# Patient Record
Sex: Female | Born: 1935 | Race: White | Hispanic: No | Smoking: Never smoker
Health system: Southern US, Community
[De-identification: ages and names within clinical notes are randomized; demographics above are authoritative.]

## PROBLEM LIST (undated history)

## (undated) DIAGNOSIS — I10 Essential (primary) hypertension: Secondary | ICD-10-CM

## (undated) DIAGNOSIS — Z803 Family history of malignant neoplasm of breast: Secondary | ICD-10-CM

## (undated) DIAGNOSIS — F32A Depression, unspecified: Secondary | ICD-10-CM

## (undated) DIAGNOSIS — C801 Malignant (primary) neoplasm, unspecified: Secondary | ICD-10-CM

## (undated) DIAGNOSIS — F329 Major depressive disorder, single episode, unspecified: Secondary | ICD-10-CM

## (undated) DIAGNOSIS — E785 Hyperlipidemia, unspecified: Secondary | ICD-10-CM

## (undated) HISTORY — DX: Essential (primary) hypertension: I10

## (undated) HISTORY — PX: LUMBAR DISC SURGERY: SHX700

## (undated) HISTORY — PX: HYSTERECTOMY ABDOMINAL WITH SALPINGO-OOPHORECTOMY: SHX6792

## (undated) HISTORY — PX: ABDOMINAL HYSTERECTOMY: SHX81

## (undated) HISTORY — PX: APPENDECTOMY: SHX54

## (undated) HISTORY — PX: CATARACT EXTRACTION, BILATERAL: SHX1313

## (undated) HISTORY — DX: Major depressive disorder, single episode, unspecified: F32.9

## (undated) HISTORY — DX: Hyperlipidemia, unspecified: E78.5

## (undated) HISTORY — PX: CHOLECYSTECTOMY: SHX55

## (undated) HISTORY — PX: BACK SURGERY: SHX140

## (undated) HISTORY — DX: Depression, unspecified: F32.A

## (undated) HISTORY — PX: INNER EAR SURGERY: SHX679

## (undated) HISTORY — DX: Family history of malignant neoplasm of breast: Z80.3

## (undated) HISTORY — PX: EYE SURGERY: SHX253

---

## 2017-09-01 ENCOUNTER — Encounter: Payer: Self-pay | Admitting: Internal Medicine

## 2017-09-04 ENCOUNTER — Encounter: Payer: Self-pay | Admitting: Internal Medicine

## 2017-09-04 ENCOUNTER — Ambulatory Visit: Payer: Medicare PPO | Admitting: Internal Medicine

## 2017-09-04 VITALS — BP 107/76 | HR 70 | Resp 16 | Ht 59.0 in | Wt 111.8 lb

## 2017-09-04 DIAGNOSIS — M1710 Unilateral primary osteoarthritis, unspecified knee: Secondary | ICD-10-CM | POA: Diagnosis not present

## 2017-09-04 DIAGNOSIS — F324 Major depressive disorder, single episode, in partial remission: Secondary | ICD-10-CM

## 2017-09-04 DIAGNOSIS — I10 Essential (primary) hypertension: Secondary | ICD-10-CM

## 2017-09-04 DIAGNOSIS — E782 Mixed hyperlipidemia: Secondary | ICD-10-CM | POA: Insufficient documentation

## 2017-09-04 MED ORDER — LOSARTAN POTASSIUM 25 MG PO TABS: 25.0000 mg | ORAL_TABLET | Freq: Every day | ORAL | 1 refills | Status: DC

## 2017-09-04 MED ORDER — SERTRALINE HCL 50 MG PO TABS: 50.0000 mg | ORAL_TABLET | Freq: Every day | ORAL | 1 refills | Status: DC

## 2017-09-04 MED ORDER — MELOXICAM 7.5 MG PO TABS: 7.5000 mg | ORAL_TABLET | Freq: Every day | ORAL | 1 refills | Status: DC

## 2017-09-04 MED ORDER — SERTRALINE HCL 25 MG PO TABS: 25.0000 mg | ORAL_TABLET | Freq: Every day | ORAL | 1 refills | Status: DC

## 2017-09-04 MED ORDER — PRAVASTATIN SODIUM 40 MG PO TABS: 40.0000 mg | ORAL_TABLET | Freq: Every day | ORAL | 1 refills | Status: DC

## 2017-09-04 NOTE — Progress Notes (Signed)
Date:  09/04/2017   Name:  Christina Mccarthy   DOB:  April 23, 1930   MRN:  [id number]   Chief Complaint: Establish Care (Moved from [place] ) and Knee Pain (Needs referral to Orthopedic she was getting steroid injections before moving here. ) She is here with her daughter Arrie Aran, with whom she lives and who is power of attorney.  Knee Pain   Incident onset: for years. There was no injury mechanism. She has tried NSAIDs (and steroid injections) for the symptoms.  Hypertension  This is a chronic problem. The problem is controlled. Pertinent negatives include no chest pain, headaches, palpitations or shortness of breath. Past treatments include angiotensin blockers.  Hyperlipidemia  This is a chronic problem. The problem is controlled. Recent lipid tests were reviewed and are normal. Pertinent negatives include no chest pain or shortness of breath. Current antihyperlipidemic treatment includes statins.  Depression         This is a chronic problem.  The onset quality is undetermined.   The problem has been gradually improving since onset.  Associated symptoms include no fatigue and no headaches.  Past treatments include SSRIs - Selective serotonin reuptake inhibitors.    Review of Systems  Constitutional: Negative for chills, fatigue and unexpected weight change.  HENT: Positive for hearing loss.   Eyes: Negative for visual disturbance.  Respiratory: Negative for cough, chest tightness, shortness of breath and wheezing.   Cardiovascular: Negative for chest pain, palpitations and leg swelling.  Gastrointestinal: Negative for abdominal pain.  Musculoskeletal: Positive for arthralgias and gait problem.  Neurological: Negative for dizziness and headaches.  Psychiatric/Behavioral: Positive for depression. Negative for dysphoric mood and sleep disturbance.    Patient Active Problem List   Diagnosis Date Noted  . Mixed hyperlipidemia 09/04/2017  . Essential hypertension 09/04/2017  . Primary  osteoarthritis of knee 09/04/2017  . Major depressive disorder with single episode, in partial remission ([place]) 09/04/2017    Prior to Admission medications   Medication Sig Start Date End Date Taking? Authorizing Provider  BIOTIN 5000 PO Take by mouth daily.   Yes [provider]  Cholecalciferol (VITAMIN D3) 1000 units CAPS Take by mouth.   Yes [provider]  losartan (COZAAR) 25 MG tablet Take 25 mg by mouth daily.  06/19/17  Yes [provider]  meloxicam (MOBIC) 7.5 MG tablet Take 7.5 mg by mouth daily.   Yes [provider]  Multiple Vitamins-Minerals (ICAPS AREDS 2 PO) Take by mouth.   Yes [provider]  Omega-3 Fatty Acids (FISH OIL PEARLS) 300 MG CAPS Take 300 mg by mouth daily.    Yes [provider]  pravastatin (PRAVACHOL) 40 MG tablet Take 40 mg by mouth.  06/19/17  Yes [provider]  sertraline (ZOLOFT) 25 MG tablet Take 25 mg by mouth daily.  06/19/17  Yes [provider]  sertraline (ZOLOFT) 50 MG tablet Take 50 mg by mouth daily.  06/28/17  Yes [provider]    No Known Allergies  Past Surgical History:  Procedure Laterality Date  . APPENDECTOMY    . CATARACT EXTRACTION, BILATERAL    . CHOLECYSTECTOMY    . HYSTERECTOMY ABDOMINAL WITH SALPINGO-OOPHORECTOMY     benign reason  . LUMBAR DISC SURGERY      Social History   Tobacco Use  . Smoking status: Never Smoker  . Smokeless tobacco: Never Used  Substance Use Topics  . Alcohol use: Never    Frequency: Never  . Drug use: Never  Medication list has been reviewed and updated.  PHQ 2/9 Scores 09/04/2017  PHQ - 2 Score 0    Physical Exam  Constitutional: She is oriented to person, place, and time. She appears well-developed. No distress.  HENT:  Head: Normocephalic and atraumatic.  Neck: Normal range of motion. Neck supple.  Cardiovascular: Normal rate, regular rhythm and normal heart sounds.  Pulmonary/Chest: Effort  normal and breath sounds normal. No respiratory distress.  Abdominal: Soft.  Musculoskeletal:       Right knee: She exhibits normal range of motion, no effusion and no bony tenderness.       Left knee: She exhibits decreased range of motion and bony tenderness. She exhibits no effusion.  Lymphadenopathy:    She has no cervical adenopathy.  Neurological: She is alert and oriented to person, place, and time. Gait (uses cane for ambulation) abnormal.  Skin: Skin is warm and dry. No rash noted.  Psychiatric: She has a normal mood and affect. Her speech is normal and behavior is normal. Thought content normal.    BP 107/76   Pulse 70   Resp 16   Ht 4\' 11"  (1.499 m)   Wt 111 lb 12.8 oz (50.7 kg)   SpO2 100%   BMI 22.58 kg/m   Assessment and Plan: 1. Essential hypertension controlled - losartan (COZAAR) 25 MG tablet; Take 1 tablet (25 mg total) by mouth daily.  Dispense: 90 tablet; Refill: 1 - CBC with Differential/Platelet - TSH  2. Mixed hyperlipidemia On statin therapy - pravastatin (PRAVACHOL) 40 MG tablet; Take 1 tablet (40 mg total) by mouth daily.  Dispense: 90 tablet; Refill: 1 - Comprehensive metabolic panel - Lipid panel  3. Primary osteoarthritis of knee, unspecified laterality Needs ortho follow up - mainly for left knee - meloxicam (MOBIC) 7.5 MG tablet; Take 1 tablet (7.5 mg total) by mouth daily.  Dispense: 90 tablet; Refill: 1 - Ambulatory referral to Orthopedic Surgery  4. Major depressive disorder with single episode, in partial remission ([place]) Doing well on current dose - sertraline (ZOLOFT) 25 MG tablet; Take 1 tablet (25 mg total) by mouth daily.  Dispense: 90 tablet; Refill: 1 - sertraline (ZOLOFT) 50 MG tablet; Take 1 tablet (50 mg total) by mouth daily.  Dispense: 90 tablet; Refill: 1  Will sign for records release to get immunization records and last labs.  Meds ordered this encounter  Medications  . losartan (COZAAR) 25 MG tablet    Sig: Take 1  tablet (25 mg total) by mouth daily.    Dispense:  90 tablet    Refill:  1  . sertraline (ZOLOFT) 25 MG tablet    Sig: Take 1 tablet (25 mg total) by mouth daily.    Dispense:  90 tablet    Refill:  1  . pravastatin (PRAVACHOL) 40 MG tablet    Sig: Take 1 tablet (40 mg total) by mouth daily.    Dispense:  90 tablet    Refill:  1  . sertraline (ZOLOFT) 50 MG tablet    Sig: Take 1 tablet (50 mg total) by mouth daily.    Dispense:  90 tablet    Refill:  1  . meloxicam (MOBIC) 7.5 MG tablet    Sig: Take 1 tablet (7.5 mg total) by mouth daily.    Dispense:  90 tablet    Refill:  1    Partially dictated using Editor, commissioning. Any errors are unintentional.  Halina Maidens, MD [facility]  09/04/2017  

## 2017-09-05 LAB — CBC WITH DIFFERENTIAL/PLATELET
BASOS ABS: 0 10*3/uL (ref 0.0–0.2)
Basos: 0 %
EOS (ABSOLUTE): 0.2 10*3/uL (ref 0.0–0.4)
EOS: 2 %
HEMATOCRIT: 41 % (ref 34.0–46.6)
Hemoglobin: 13.1 g/dL (ref 11.1–15.9)
Immature Grans (Abs): 0 10*3/uL (ref 0.0–0.1)
Immature Granulocytes: 0 %
LYMPHS ABS: 1.5 10*3/uL (ref 0.7–3.1)
Lymphs: 18 %
MCH: 29.1 pg (ref 26.6–33.0)
MCHC: 32 g/dL (ref 31.5–35.7)
MCV: 91 fL (ref 79–97)
Monocytes Absolute: 0.6 10*3/uL (ref 0.1–0.9)
Monocytes: 7 %
Neutrophils Absolute: 6.4 10*3/uL (ref 1.4–7.0)
Neutrophils: 73 %
Platelets: 265 10*3/uL (ref 150–379)
RBC: 4.5 x10E6/uL (ref 3.77–5.28)
RDW: 14.2 % (ref 12.3–15.4)
WBC: 8.7 10*3/uL (ref 3.4–10.8)

## 2017-09-05 LAB — COMPREHENSIVE METABOLIC PANEL
ALK PHOS: 78 IU/L (ref 39–117)
ALT: 9 IU/L (ref 0–32)
AST: 21 IU/L (ref 0–40)
Albumin/Globulin Ratio: 2.4 — ABNORMAL HIGH (ref 1.2–2.2)
Albumin: 4.5 g/dL (ref 3.5–4.7)
BUN/Creatinine Ratio: 23 (ref 12–28)
BUN: 24 mg/dL (ref 8–27)
Bilirubin Total: 0.5 mg/dL (ref 0.0–1.2)
CO2: 23 mmol/L (ref 20–29)
CREATININE: 1.03 mg/dL — AB (ref 0.57–1.00)
Calcium: 10.2 mg/dL (ref 8.7–10.3)
Chloride: 100 mmol/L (ref 96–106)
GFR calc Af Amer: 57 mL/min/{1.73_m2} — ABNORMAL LOW (ref >59)
GFR, EST NON AFRICAN AMERICAN: 49 mL/min/{1.73_m2} — AB (ref >59)
GLOBULIN, TOTAL: 1.9 g/dL (ref 1.5–4.5)
Glucose: 90 mg/dL (ref 65–99)
Potassium: 4.9 mmol/L (ref 3.5–5.2)
SODIUM: 138 mmol/L (ref 134–144)
Total Protein: 6.4 g/dL (ref 6.0–8.5)

## 2017-09-05 LAB — TSH: TSH: 3.95 u[IU]/mL (ref 0.450–4.500)

## 2017-09-05 LAB — LIPID PANEL
Chol/HDL Ratio: 3.6 ratio (ref 0.0–4.4)
Cholesterol, Total: 209 mg/dL — ABNORMAL HIGH (ref 100–199)
HDL: 58 mg/dL (ref >39)
LDL CALC: 120 mg/dL — AB (ref 0–99)
TRIGLYCERIDES: 155 mg/dL — AB (ref 0–149)
VLDL Cholesterol Cal: 31 mg/dL (ref 5–40)

## 2017-09-13 ENCOUNTER — Other Ambulatory Visit: Payer: Self-pay

## 2017-09-13 DIAGNOSIS — F324 Major depressive disorder, single episode, in partial remission: Secondary | ICD-10-CM

## 2017-09-13 DIAGNOSIS — M1710 Unilateral primary osteoarthritis, unspecified knee: Secondary | ICD-10-CM

## 2017-09-13 MED ORDER — SERTRALINE HCL 50 MG PO TABS: 50.0000 mg | ORAL_TABLET | Freq: Every day | ORAL | 0 refills | Status: DC

## 2017-09-13 MED ORDER — SERTRALINE HCL 25 MG PO TABS: 25.0000 mg | ORAL_TABLET | Freq: Every day | ORAL | 0 refills | Status: DC

## 2017-09-13 MED ORDER — MELOXICAM 7.5 MG PO TABS: 7.5000 mg | ORAL_TABLET | Freq: Every day | ORAL | 0 refills | Status: DC

## 2017-10-10 ENCOUNTER — Other Ambulatory Visit: Payer: Self-pay | Admitting: Internal Medicine

## 2017-10-10 DIAGNOSIS — F324 Major depressive disorder, single episode, in partial remission: Secondary | ICD-10-CM

## 2017-11-03 ENCOUNTER — Other Ambulatory Visit: Payer: Self-pay | Admitting: Internal Medicine

## 2017-11-03 DIAGNOSIS — F324 Major depressive disorder, single episode, in partial remission: Secondary | ICD-10-CM

## 2017-11-06 ENCOUNTER — Other Ambulatory Visit: Payer: Self-pay | Admitting: Internal Medicine

## 2017-12-12 DIAGNOSIS — M170 Bilateral primary osteoarthritis of knee: Secondary | ICD-10-CM | POA: Diagnosis not present

## 2017-12-21 ENCOUNTER — Other Ambulatory Visit: Payer: Self-pay | Admitting: Internal Medicine

## 2017-12-21 DIAGNOSIS — I10 Essential (primary) hypertension: Secondary | ICD-10-CM

## 2017-12-21 DIAGNOSIS — F324 Major depressive disorder, single episode, in partial remission: Secondary | ICD-10-CM

## 2017-12-21 DIAGNOSIS — M1710 Unilateral primary osteoarthritis, unspecified knee: Secondary | ICD-10-CM

## 2017-12-21 DIAGNOSIS — E782 Mixed hyperlipidemia: Secondary | ICD-10-CM

## 2018-01-10 ENCOUNTER — Ambulatory Visit: Payer: Medicare PPO

## 2018-02-21 ENCOUNTER — Ambulatory Visit (INDEPENDENT_AMBULATORY_CARE_PROVIDER_SITE_OTHER): Payer: Medicare PPO

## 2018-02-21 DIAGNOSIS — Z23 Encounter for immunization: Secondary | ICD-10-CM | POA: Diagnosis not present

## 2018-03-09 ENCOUNTER — Encounter: Payer: Self-pay | Admitting: Internal Medicine

## 2018-03-09 ENCOUNTER — Ambulatory Visit (INDEPENDENT_AMBULATORY_CARE_PROVIDER_SITE_OTHER): Payer: Medicare PPO | Admitting: Internal Medicine

## 2018-03-09 VITALS — BP 122/74 | HR 74 | Resp 16 | Ht 59.0 in | Wt 114.0 lb

## 2018-03-09 DIAGNOSIS — C50912 Malignant neoplasm of unspecified site of left female breast: Secondary | ICD-10-CM | POA: Insufficient documentation

## 2018-03-09 DIAGNOSIS — Z0000 Encounter for general adult medical examination without abnormal findings: Secondary | ICD-10-CM | POA: Diagnosis not present

## 2018-03-09 DIAGNOSIS — E782 Mixed hyperlipidemia: Secondary | ICD-10-CM

## 2018-03-09 DIAGNOSIS — M170 Bilateral primary osteoarthritis of knee: Secondary | ICD-10-CM

## 2018-03-09 DIAGNOSIS — I10 Essential (primary) hypertension: Secondary | ICD-10-CM

## 2018-03-09 DIAGNOSIS — F324 Major depressive disorder, single episode, in partial remission: Secondary | ICD-10-CM

## 2018-03-09 DIAGNOSIS — N63 Unspecified lump in unspecified breast: Secondary | ICD-10-CM

## 2018-03-09 NOTE — Patient Instructions (Signed)
Someone from [facility] will call you to schedule the Mammograms.

## 2018-03-09 NOTE — Progress Notes (Signed)
Date:  03/09/2018   Name:  Christina Mccarthy   DOB:  April 17, 1930   MRN:  [id number]   Chief Complaint: Annual Exam and Knee Pain (ortho appt Wed for injections left knee pain and pain under thights in R and L legs ) Christina Mccarthy is a 84 y.o. female who presents today for her Complete Annual Exam. She feels fairly well. She reports exercising none but has worsening knee pain. She reports she is sleeping fairly well.   Hypertension  This is a chronic problem. The problem is controlled. Pertinent negatives include no chest pain, headaches, palpitations or shortness of breath. Past treatments include angiotensin blockers. The current treatment provides significant improvement.  Depression         This is a chronic problem.The problem is unchanged.  Associated symptoms include no fatigue and no headaches.  Past treatments include SSRIs - Selective serotonin reuptake inhibitors.  Compliance with treatment is good.  Previous treatment provided significant relief. Hyperlipidemia  The problem is controlled. Pertinent negatives include no chest pain or shortness of breath. Current antihyperlipidemic treatment includes statins. The current treatment provides significant improvement of lipids.    Review of Systems  Constitutional: Negative for chills, fatigue and fever.  HENT: Negative for congestion, hearing loss, tinnitus, trouble swallowing and voice change.   Eyes: Negative for visual disturbance.  Respiratory: Negative for cough, chest tightness, shortness of breath and wheezing.   Cardiovascular: Negative for chest pain, palpitations and leg swelling.  Gastrointestinal: Negative for abdominal pain, constipation, diarrhea and vomiting.  Endocrine: Negative for polydipsia and polyuria.  Genitourinary: Negative for dysuria, frequency, genital sores, vaginal bleeding and vaginal discharge.  Musculoskeletal: Negative for arthralgias, gait problem and joint swelling.  Skin: Negative for color change and  rash.  Neurological: Negative for dizziness, tremors, light-headedness and headaches.  Hematological: Negative for adenopathy. Does not bruise/bleed easily.  Psychiatric/Behavioral: Positive for depression. Negative for dysphoric mood and sleep disturbance. The patient is not nervous/anxious.     Patient Active Problem List   Diagnosis Date Noted  . Mixed hyperlipidemia 09/04/2017  . Essential hypertension 09/04/2017  . Primary osteoarthritis of knee 09/04/2017  . Major depressive disorder with single episode, in partial remission ([place]) 09/04/2017    No Known Allergies  Past Surgical History:  Procedure Laterality Date  . APPENDECTOMY    . CATARACT EXTRACTION, BILATERAL    . CHOLECYSTECTOMY    . HYSTERECTOMY ABDOMINAL WITH SALPINGO-OOPHORECTOMY     benign reason  . LUMBAR DISC SURGERY      Social History   Tobacco Use  . Smoking status: Never Smoker  . Smokeless tobacco: Never Used  Substance Use Topics  . Alcohol use: Never    Frequency: Never  . Drug use: Never     Medication list has been reviewed and updated.  Current Meds  Medication Sig  . BIOTIN 5000 PO Take by mouth daily.  . Cholecalciferol (VITAMIN D3) 1000 units CAPS Take by mouth.  . losartan (COZAAR) 25 MG tablet TAKE 1 TABLET DAILY.  . meloxicam (MOBIC) 7.5 MG tablet TAKE 1 TABLET DAILY.  . Multiple Vitamins-Minerals (ICAPS AREDS 2 PO) Take by mouth.  . Omega-3 Fatty Acids (FISH OIL PEARLS) 300 MG CAPS Take 300 mg by mouth daily.   . pravastatin (PRAVACHOL) 40 MG tablet TAKE 1 TABLET DAILY.  Marland Kitchen sertraline (ZOLOFT) 25 MG tablet TAKE 1 TABLET DAILY.  Marland Kitchen sertraline (ZOLOFT) 50 MG tablet TAKE 1 TABLET DAILY.    PHQ 2/9 Scores 03/09/2018 09/04/2017  PHQ - 2 Score 0 0  PHQ- 9 Score 0 -    Physical Exam  Constitutional: She is oriented to person, place, and time. She appears well-developed. No distress.  HENT:  Head: Normocephalic and atraumatic.  Right Ear: Decreased hearing is noted.  Left Ear:  Decreased hearing is noted.  Hearing aids in place  Eyes: Pupils are equal, round, and reactive to light.  Neck: Normal range of motion. Neck supple.  Cardiovascular: Normal rate, regular rhythm and normal heart sounds.  No murmur heard. Pulmonary/Chest: Effort normal and breath sounds normal. No respiratory distress. She has no wheezes. She has no rales.    Abdominal: Soft. Normal appearance and bowel sounds are normal. There is no tenderness.  Musculoskeletal: She exhibits no edema.       Right knee: She exhibits decreased range of motion. She exhibits no swelling and no effusion.       Left knee: She exhibits decreased range of motion. She exhibits no swelling and no effusion.  Lymphadenopathy:    She has no cervical adenopathy.  Neurological: She is alert and oriented to person, place, and time. No sensory deficit. Gait (uses rollator) abnormal.  Skin: Skin is warm and dry. No rash noted.  Psychiatric: She has a normal mood and affect. Her speech is normal and behavior is normal. Thought content normal. Cognition and memory are normal.  Nursing note and vitals reviewed.   BP 122/74   Pulse 74   Resp 16   Ht 4\' 11"  (1.499 m)   Wt 114 lb (51.7 kg)   SpO2 98%   BMI 23.03 kg/m   Assessment and Plan: 1. Annual physical exam Generally doing well Will schedule MAW  2. Essential hypertension Controlled, continue current therapy - CBC with Differential/Platelet - Comprehensive metabolic panel - TSH  3. Mixed hyperlipidemia On statin medication - Lipid panel  4. Major depressive disorder with single episode, in partial remission ([place]) Doing well  5. Breast mass in female Needs further evaluation - MM DIAG BREAST TOMO BILATERAL; Future - US BREAST LTD UNI LEFT INC AXILLA; Future - US BREAST LTD UNI RIGHT INC AXILLA; Future  6. Primary osteoarthritis of both knees Right worse than left Follow up with Ortho as planned  Partially dictated using Editor, commissioning. Any  errors are unintentional.  Halina Maidens, MD [facility]  03/09/2018

## 2018-03-10 LAB — COMPREHENSIVE METABOLIC PANEL
A/G RATIO: 2.8 — AB (ref 1.2–2.2)
ALBUMIN: 4.4 g/dL (ref 3.5–4.7)
ALT: 11 IU/L (ref 0–32)
AST: 22 IU/L (ref 0–40)
Alkaline Phosphatase: 80 IU/L (ref 39–117)
BILIRUBIN TOTAL: 0.4 mg/dL (ref 0.0–1.2)
BUN / CREAT RATIO: 15 (ref 12–28)
BUN: 15 mg/dL (ref 8–27)
CHLORIDE: 98 mmol/L (ref 96–106)
CO2: 25 mmol/L (ref 20–29)
Calcium: 9.7 mg/dL (ref 8.7–10.3)
Creatinine, Ser: 0.99 mg/dL (ref 0.57–1.00)
GFR calc Af Amer: 60 mL/min/{1.73_m2} (ref >59)
GFR calc non Af Amer: 52 mL/min/{1.73_m2} — ABNORMAL LOW (ref >59)
GLOBULIN, TOTAL: 1.6 g/dL (ref 1.5–4.5)
Glucose: 76 mg/dL (ref 65–99)
POTASSIUM: 5 mmol/L (ref 3.5–5.2)
SODIUM: 137 mmol/L (ref 134–144)
Total Protein: 6 g/dL (ref 6.0–8.5)

## 2018-03-10 LAB — CBC WITH DIFFERENTIAL/PLATELET
Basophils Absolute: 0.1 10*3/uL (ref 0.0–0.2)
Basos: 1 %
EOS (ABSOLUTE): 0.2 10*3/uL (ref 0.0–0.4)
EOS: 2 %
HEMATOCRIT: 39.8 % (ref 34.0–46.6)
HEMOGLOBIN: 13.1 g/dL (ref 11.1–15.9)
Immature Grans (Abs): 0 10*3/uL (ref 0.0–0.1)
Immature Granulocytes: 0 %
LYMPHS ABS: 1.2 10*3/uL (ref 0.7–3.1)
Lymphs: 14 %
MCH: 29.4 pg (ref 26.6–33.0)
MCHC: 32.9 g/dL (ref 31.5–35.7)
MCV: 89 fL (ref 79–97)
MONOS ABS: 0.5 10*3/uL (ref 0.1–0.9)
Monocytes: 6 %
NEUTROS ABS: 6.5 10*3/uL (ref 1.4–7.0)
Neutrophils: 77 %
Platelets: 297 10*3/uL (ref 150–450)
RBC: 4.46 x10E6/uL (ref 3.77–5.28)
RDW: 12.5 % (ref 12.3–15.4)
WBC: 8.5 10*3/uL (ref 3.4–10.8)

## 2018-03-10 LAB — LIPID PANEL
CHOL/HDL RATIO: 3.6 ratio (ref 0.0–4.4)
Cholesterol, Total: 200 mg/dL — ABNORMAL HIGH (ref 100–199)
HDL: 56 mg/dL (ref >39)
LDL CALC: 122 mg/dL — AB (ref 0–99)
Triglycerides: 108 mg/dL (ref 0–149)
VLDL Cholesterol Cal: 22 mg/dL (ref 5–40)

## 2018-03-10 LAB — TSH: TSH: 2.9 u[IU]/mL (ref 0.450–4.500)

## 2018-03-14 DIAGNOSIS — M170 Bilateral primary osteoarthritis of knee: Secondary | ICD-10-CM | POA: Diagnosis not present

## 2018-03-26 ENCOUNTER — Ambulatory Visit: Payer: Medicare PPO

## 2018-04-01 DIAGNOSIS — C50919 Malignant neoplasm of unspecified site of unspecified female breast: Secondary | ICD-10-CM

## 2018-04-01 HISTORY — PX: BREAST BIOPSY: SHX20

## 2018-04-01 HISTORY — DX: Malignant neoplasm of unspecified site of unspecified female breast: C50.919

## 2018-04-13 ENCOUNTER — Ambulatory Visit
Admission: RE | Admit: 2018-04-13 | Discharge: 2018-04-13 | Disposition: A | Discharge status: Home / Self Care | Payer: Medicare PPO | Source: Ambulatory Visit | Attending: Internal Medicine | Admitting: Internal Medicine

## 2018-04-13 ENCOUNTER — Encounter: Payer: Self-pay | Admitting: Radiology

## 2018-04-13 DIAGNOSIS — N6321 Unspecified lump in the left breast, upper outer quadrant: Secondary | ICD-10-CM | POA: Insufficient documentation

## 2018-04-13 DIAGNOSIS — N63 Unspecified lump in unspecified breast: Secondary | ICD-10-CM

## 2018-04-13 DIAGNOSIS — N6310 Unspecified lump in the right breast, unspecified quadrant: Secondary | ICD-10-CM | POA: Insufficient documentation

## 2018-04-13 DIAGNOSIS — C801 Malignant (primary) neoplasm, unspecified: Secondary | ICD-10-CM

## 2018-04-13 DIAGNOSIS — N6489 Other specified disorders of breast: Secondary | ICD-10-CM | POA: Diagnosis not present

## 2018-04-13 DIAGNOSIS — R928 Other abnormal and inconclusive findings on diagnostic imaging of breast: Secondary | ICD-10-CM | POA: Diagnosis not present

## 2018-04-13 HISTORY — DX: Malignant (primary) neoplasm, unspecified: C80.1

## 2018-04-16 ENCOUNTER — Other Ambulatory Visit: Payer: Self-pay | Admitting: Internal Medicine

## 2018-04-16 DIAGNOSIS — R928 Other abnormal and inconclusive findings on diagnostic imaging of breast: Secondary | ICD-10-CM

## 2018-04-16 DIAGNOSIS — N6320 Unspecified lump in the left breast, unspecified quadrant: Secondary | ICD-10-CM

## 2018-04-18 ENCOUNTER — Ambulatory Visit
Admission: RE | Admit: 2018-04-18 | Discharge: 2018-04-18 | Disposition: A | Discharge status: Home / Self Care | Payer: Medicare PPO | Source: Ambulatory Visit | Attending: Internal Medicine | Admitting: Internal Medicine

## 2018-04-18 DIAGNOSIS — N6320 Unspecified lump in the left breast, unspecified quadrant: Secondary | ICD-10-CM

## 2018-04-18 DIAGNOSIS — R928 Other abnormal and inconclusive findings on diagnostic imaging of breast: Secondary | ICD-10-CM

## 2018-04-18 DIAGNOSIS — C50412 Malignant neoplasm of upper-outer quadrant of left female breast: Secondary | ICD-10-CM | POA: Diagnosis not present

## 2018-04-18 DIAGNOSIS — N6321 Unspecified lump in the left breast, upper outer quadrant: Secondary | ICD-10-CM | POA: Diagnosis not present

## 2018-04-20 ENCOUNTER — Encounter: Payer: Self-pay | Admitting: *Deleted

## 2018-04-20 ENCOUNTER — Encounter: Payer: Self-pay | Admitting: Internal Medicine

## 2018-04-20 DIAGNOSIS — C50912 Malignant neoplasm of unspecified site of left female breast: Secondary | ICD-10-CM

## 2018-04-20 NOTE — Progress Notes (Signed)
  Oncology Nurse Navigator Documentation  Navigator Location: [facility] (04/20/18 1200)   )Navigator Encounter Type: Introductory phone call (04/20/18 1200)   Abnormal Finding Date: 04/13/18 (04/20/18 1200) Confirmed Diagnosis Date: 04/19/18 (04/20/18 1200)                   Barriers/Navigation Needs: Coordination of Care (04/20/18 1200)   Interventions: Coordination of Care (04/20/18 1200)   Coordination of Care: Appts (04/20/18 1200)                  Time Spent with Patient: 15 (04/20/18 1200)   Called patient and spoke to her daughter Boyd.  Introduced to navigation services.  Patient's daughter had been informed of patient's diagnosis by Dr. Enriqueta Shutter in radiology.  Appointment scheduled to see Dr. Dahlia Byes on 04/23/18 @ 9:30.  Will schedule medical oncology consult on Monday.  Dawn informed I would call her back on Monday.  She is to call with any questions or needs.

## 2018-04-22 NOTE — Progress Notes (Signed)
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  INTERVAL HISTORY: Patient is an 84 year old female who noticed a mass in her left breast on self-exam.  Subsequent mammogram, ultrasound, biopsy revealed the above-stated malignancy.  She currently feels well and is asymptomatic.  She remains active.  She has no neurologic complaints.  She denies any recent fevers or illnesses.  She has a good appetite and denies weight loss.  She has no chest pain or shortness of breath.  She denies any nausea, vomiting, constipation, or diarrhea.  She has no urinary complaints.  Patient feels at her baseline offers no further specific complaints today.  REVIEW OF SYSTEMS:   Review of Systems  Constitutional: Negative.  Negative for fever, malaise/fatigue and weight loss.  Respiratory: Negative.  Negative for cough and shortness of breath.   Cardiovascular: Negative.  Negative for chest pain and leg swelling.  Gastrointestinal: Negative.  Negative for abdominal pain.  Genitourinary: Negative.  Negative for dysuria.  Musculoskeletal: Negative.  Negative for back pain.  Skin: Negative.  Negative for rash.  Neurological: Negative.  Negative for sensory change, focal weakness and weakness.  Psychiatric/Behavioral: Negative.  The patient is not nervous/anxious.     As per HPI. Otherwise, a complete review of systems is negative.  PAST MEDICAL HISTORY: Past Medical History:  Diagnosis Date  . Depression   . Hyperlipidemia   . Hypertension     PAST SURGICAL HISTORY: Past Surgical History:  Procedure Laterality Date  . APPENDECTOMY    . CATARACT  EXTRACTION, BILATERAL    . CHOLECYSTECTOMY    . EYE SURGERY    . HYSTERECTOMY ABDOMINAL WITH SALPINGO-OOPHORECTOMY     benign reason  . LUMBAR DISC SURGERY      FAMILY HISTORY: Family History  Problem Relation Age of Onset  . Breast cancer Mother 28  . CAD Father     ADVANCED DIRECTIVES (Y/N):  N  HEALTH MAINTENANCE: Social History   Tobacco Use  . Smoking status: Never Smoker  . Smokeless tobacco: Never Used  Substance Use Topics  . Alcohol use: Never    Frequency: Never  . Drug use: Never     Colonoscopy:  PAP:  Bone density:  Lipid panel:  No Known Allergies  Current Outpatient Medications  Medication Sig Dispense Refill  . BIOTIN 5000 PO Take by mouth daily.    . Cholecalciferol (VITAMIN D3) 1000 units CAPS Take by mouth.    . losartan (COZAAR) 25 MG tablet TAKE 1 TABLET DAILY. 90 tablet 1  . meloxicam (MOBIC) 7.5 MG tablet TAKE 1 TABLET DAILY. 90 tablet 1  . Multiple Vitamins-Minerals (ICAPS AREDS 2 PO) Take by mouth.    . Omega-3 Fatty Acids (FISH OIL PEARLS) 300 MG CAPS Take 300 mg by mouth daily.     . pravastatin (PRAVACHOL) 40 MG tablet TAKE 1 TABLET DAILY. 90 tablet 1  . sertraline (ZOLOFT) 25 MG tablet TAKE 1 TABLET DAILY. 90 tablet 1  . sertraline (ZOLOFT) 50 MG tablet TAKE 1 TABLET DAILY. 90 tablet 1   No current facility-administered medications for this visit.     OBJECTIVE: Vitals:   04/23/18 1050  BP: 140/70  Pulse: 72  Temp: 97.7 F (36.5 C)     Body mass index is 22.42 kg/m.    ECOG FS:0 - Asymptomatic  General: Well-developed, well-nourished, no acute distress. Eyes: Pink conjunctiva, anicteric sclera. HEENT: Normocephalic, moist mucous membranes, clear oropharnyx. Breast: Patient had exam by surgeon this morning and requested exam be deferred. Lungs: Clear to auscultation bilaterally. Heart: Regular rate and rhythm. No rubs, murmurs, or gallops. Abdomen: Soft, nontender, nondistended. No organomegaly noted, normoactive bowel  sounds. Musculoskeletal: No edema, cyanosis, or clubbing. Neuro: Alert, answering all questions appropriately. Cranial nerves grossly intact. Skin: No rashes or petechiae noted. Psych: Normal affect. Lymphatics: No cervical, calvicular, axillary or inguinal LAD.   LAB RESULTS:  Lab Results  Component Value Date   NA 137 03/09/2018   K 5.0 03/09/2018   CL 98 03/09/2018   CO2 25 03/09/2018   GLUCOSE 76 03/09/2018   BUN 15 03/09/2018   CREATININE 0.99 03/09/2018   CALCIUM 9.7 03/09/2018   PROT 6.0 03/09/2018   ALBUMIN 4.4 03/09/2018   AST 22 03/09/2018   ALT 11 03/09/2018   ALKPHOS 80 03/09/2018   BILITOT 0.4 03/09/2018   GFRNONAA 52 (L) 03/09/2018   GFRAA 60 03/09/2018    Lab Results  Component Value Date   WBC 8.5 03/09/2018   NEUTROABS 6.5 03/09/2018   HGB 13.1 03/09/2018   HCT 39.8 03/09/2018   MCV 89 03/09/2018   PLT 297 03/09/2018     STUDIES: US Breast Ltd Uni Left Inc Axilla  Result Date: 04/13/2018 CLINICAL DATA:  84 year old female with palpable masses in both breasts discovered on self and clinical examination. EXAM: DIGITAL DIAGNOSTIC BILATERAL MAMMOGRAM WITH CAD AND TOMO ULTRASOUND BILATERAL BREAST COMPARISON:  Previous exam(s). ACR Breast Density Category b: There are scattered areas of fibroglandular density. FINDINGS: 2D/3D full field views of both breasts and a spot compression view of the LEFT breast are performed. No suspicious mammographic abnormalities are identified within the RIGHT breast. An irregular mass within the UPPER-OUTER LEFT breast is noted. Mammographic images were processed with CAD. On physical exam, a firm palpable mass identified at the 1 o'clock position of the LEFT breast 4 cm from the nipple. Targeted ultrasound is performed, showing . no suspicious abnormalities are identified within the RIGHT breast. A 2.2 x 1.3 x 2.3 cm irregular hypoechoic mass at the 1 o'clock position of the LEFT breast 4 cm from the nipple is identified. No  abnormal LEFT axillary lymph nodes are noted. IMPRESSION: 1. Highly suspicious 2.3 cm mass within the UPPER OUTER LEFT breast. Tissue sampling is recommended. 2. No abnormal appearing LEFT axillary lymph nodes. 3. No mammographic evidence of RIGHT breast malignancy. RECOMMENDATION: Ultrasound-guided LEFT breast biopsy. I have discussed the findings and recommendations with the patient. Results were also provided in writing at the conclusion of the visit. If applicable, a reminder letter will be sent to the patient regarding the next appointment. BI-RADS CATEGORY  5: Highly suggestive of malignancy. Electronically Signed   By: Margarette Canada M.D.   On: 04/13/2018 11:10   US Breast Ltd Uni Right Inc Axilla  Result Date: 04/13/2018 CLINICAL DATA:  84 year old female with palpable masses in both breasts discovered on self and clinical examination. EXAM: DIGITAL DIAGNOSTIC BILATERAL MAMMOGRAM WITH CAD AND TOMO ULTRASOUND BILATERAL BREAST COMPARISON:  Previous exam(s). ACR Breast Density Category b: There are scattered areas of fibroglandular density. FINDINGS: 2D/3D full field views of both breasts and a spot compression view of the LEFT breast are performed. No suspicious mammographic abnormalities  are identified within the RIGHT breast. An irregular mass within the UPPER-OUTER LEFT breast is noted. Mammographic images were processed with CAD. On physical exam, a firm palpable mass identified at the 1 o'clock position of the LEFT breast 4 cm from the nipple. Targeted ultrasound is performed, showing . no suspicious abnormalities are identified within the RIGHT breast. A 2.2 x 1.3 x 2.3 cm irregular hypoechoic mass at the 1 o'clock position of the LEFT breast 4 cm from the nipple is identified. No abnormal LEFT axillary lymph nodes are noted. IMPRESSION: 1. Highly suspicious 2.3 cm mass within the UPPER OUTER LEFT breast. Tissue sampling is recommended. 2. No abnormal appearing LEFT axillary lymph nodes. 3. No  mammographic evidence of RIGHT breast malignancy. RECOMMENDATION: Ultrasound-guided LEFT breast biopsy. I have discussed the findings and recommendations with the patient. Results were also provided in writing at the conclusion of the visit. If applicable, a reminder letter will be sent to the patient regarding the next appointment. BI-RADS CATEGORY  5: Highly suggestive of malignancy. Electronically Signed   By: Margarette Canada M.D.   On: 04/13/2018 11:10   Mm Diag Breast Tomo Bilateral  Result Date: 04/13/2018 CLINICAL DATA:  84 year old female with palpable masses in both breasts discovered on self and clinical examination. EXAM: DIGITAL DIAGNOSTIC BILATERAL MAMMOGRAM WITH CAD AND TOMO ULTRASOUND BILATERAL BREAST COMPARISON:  Previous exam(s). ACR Breast Density Category b: There are scattered areas of fibroglandular density. FINDINGS: 2D/3D full field views of both breasts and a spot compression view of the LEFT breast are performed. No suspicious mammographic abnormalities are identified within the RIGHT breast. An irregular mass within the UPPER-OUTER LEFT breast is noted. Mammographic images were processed with CAD. On physical exam, a firm palpable mass identified at the 1 o'clock position of the LEFT breast 4 cm from the nipple. Targeted ultrasound is performed, showing . no suspicious abnormalities are identified within the RIGHT breast. A 2.2 x 1.3 x 2.3 cm irregular hypoechoic mass at the 1 o'clock position of the LEFT breast 4 cm from the nipple is identified. No abnormal LEFT axillary lymph nodes are noted. IMPRESSION: 1. Highly suspicious 2.3 cm mass within the UPPER OUTER LEFT breast. Tissue sampling is recommended. 2. No abnormal appearing LEFT axillary lymph nodes. 3. No mammographic evidence of RIGHT breast malignancy. RECOMMENDATION: Ultrasound-guided LEFT breast biopsy. I have discussed the findings and recommendations with the patient. Results were also provided in writing at the conclusion of  the visit. If applicable, a reminder letter will be sent to the patient regarding the next appointment. BI-RADS CATEGORY  5: Highly suggestive of malignancy. Electronically Signed   By: Margarette Canada M.D.   On: 04/13/2018 11:10   Mm Clip Placement Left  Result Date: 04/18/2018 CLINICAL DATA:  Status post ultrasound-guided core needle biopsy of a left breast mass. EXAM: DIAGNOSTIC LEFT MAMMOGRAM POST ULTRASOUND BIOPSY COMPARISON:  Previous exam(s). FINDINGS: Mammographic images were obtained following ultrasound guided biopsy of a left breast mass. The heart shaped biopsy clip lies within the mass. IMPRESSION: Well-positioned heart shaped biopsy clip following ultrasound-guided core needle biopsy the left breast. Final Assessment: Post Procedure Mammograms for Marker Placement Electronically Signed   By: Lajean Manes M.D.   On: 04/18/2018 08:45   [place] Lt Breast Bx W Loc Dev 1st Lesion Img Bx Spec US Guide  Addendum Date: 04/20/2018   ADDENDUM REPORT: 04/20/2018 10:29 ADDENDUM: PATHOLOGY: Invasive mammary carcinoma, no special type.  Grade 2. CONCORDANT:  Yes I discussed these results and  the recommendations below with the patient's daughter by telephone on 04/20/2018 at 10:30 a.m. All of her questions were answered. She denies significant pain or bleeding at the biopsy site. RECOMMENDATION:  Surgical consultation. Electronically Signed   By: Franki Cabot M.D.   On: 04/20/2018 10:29   Result Date: 04/20/2018 CLINICAL DATA:  Patient presents for ultrasound-guided core needle biopsy of a left breast mass. EXAM: ULTRASOUND GUIDED LEFT BREAST CORE NEEDLE BIOPSY COMPARISON:  Previous exam(s). FINDINGS: I met with the patient and we discussed the procedure of ultrasound-guided biopsy, including benefits and alternatives. We discussed the high likelihood of a successful procedure. We discussed the risks of the procedure, including infection, bleeding, tissue injury, clip migration, and inadequate sampling.  Informed written consent was given. The usual time-out protocol was performed immediately prior to the procedure. Lesion quadrant: Upper outer quadrant Using sterile technique and 1% Lidocaine as local anesthetic, under direct ultrasound visualization, a 12 gauge spring-loaded device was used to perform biopsy of the 1 o'clock position left breast mass using a inferolateral approach. At the conclusion of the procedure a heart shaped tissue marker clip was deployed into the biopsy cavity. Follow up 2 view mammogram was performed and dictated separately. IMPRESSION: Ultrasound guided biopsy of a left breast mass. No apparent complications. Electronically Signed: By: Lajean Manes M.D. 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  Patient has a follow-up appointment on May 09, 2018 to discuss whether to proceed with mastectomy or lumpectomy plus adjuvant radiation.  Will send Oncotype DX score for completeness, but given her advanced age will likely only use this for prognostic indications and likely will not pursue adjuvant chemotherapy.  Given the ER/PR status of her tumor, she will benefit from an aromatase inhibitor for 5 years at the conclusion of her treatments.  Return to clinic 1 to 2 weeks postoperatively to discuss her final pathology results and additional treatment planning if necessary.  I spent a total of 60 minutes face-to-face with the patient of which greater than 50% of the visit was spent in counseling and coordination of care as detailed above.   Patient expressed understanding and was in agreement with this plan. She also understands that She can call clinic at any time with any questions, concerns, or  complaints.   Cancer Staging Breast cancer, left breast [facility]) Staging form: Breast, AJCC 8th Edition - Clinical stage from 04/22/2018: Stage IB (cT2, cN0, cM0, G2, ER+, PR+, HER2-) - Signed by Lloyd Huger, MD on 04/24/2018   Lloyd Huger, MD   04/24/2018 10:56 AM

## 2018-04-23 ENCOUNTER — Encounter: Payer: Self-pay | Admitting: Surgery

## 2018-04-23 ENCOUNTER — Other Ambulatory Visit: Payer: Self-pay

## 2018-04-23 ENCOUNTER — Other Ambulatory Visit: Payer: Self-pay | Admitting: Pathology

## 2018-04-23 ENCOUNTER — Ambulatory Visit (INDEPENDENT_AMBULATORY_CARE_PROVIDER_SITE_OTHER): Payer: Medicare PPO | Admitting: Surgery

## 2018-04-23 ENCOUNTER — Encounter: Payer: Self-pay | Admitting: *Deleted

## 2018-04-23 ENCOUNTER — Inpatient Hospital Stay: Payer: Medicare PPO | Attending: Oncology | Admitting: Oncology

## 2018-04-23 VITALS — BP 140/70 | HR 72 | Temp 97.7°F | Ht 59.0 in | Wt 111.0 lb

## 2018-04-23 VITALS — BP 140/70 | HR 72 | Temp 97.7°F | Resp 13 | Ht 59.0 in | Wt 111.0 lb

## 2018-04-23 DIAGNOSIS — Z79899 Other long term (current) drug therapy: Secondary | ICD-10-CM | POA: Diagnosis not present

## 2018-04-23 DIAGNOSIS — Z90710 Acquired absence of both cervix and uterus: Secondary | ICD-10-CM

## 2018-04-23 DIAGNOSIS — C50412 Malignant neoplasm of upper-outer quadrant of left female breast: Secondary | ICD-10-CM | POA: Insufficient documentation

## 2018-04-23 DIAGNOSIS — I10 Essential (primary) hypertension: Secondary | ICD-10-CM | POA: Insufficient documentation

## 2018-04-23 DIAGNOSIS — F329 Major depressive disorder, single episode, unspecified: Secondary | ICD-10-CM | POA: Diagnosis not present

## 2018-04-23 DIAGNOSIS — Z170 Estrogen receptor positive status [ER+]: Secondary | ICD-10-CM | POA: Diagnosis not present

## 2018-04-23 DIAGNOSIS — C50411 Malignant neoplasm of upper-outer quadrant of right female breast: Secondary | ICD-10-CM | POA: Diagnosis not present

## 2018-04-23 LAB — SURGICAL PATHOLOGY

## 2018-04-23 NOTE — H&P (View-Only) (Signed)
Patient ID: Christina Mccarthy, female   DOB: 1930/05/01, 84 y.o.   MRN: [id number]  HPI Christina Mccarthy is a 84 y.o. female seen for a newly diagnosed  Left breast CA.  Menarche age 47, Hysterectomy and BSO age 35. No hormonal replacement. No fam hx breast CA. reports that she fell a mass about a month or so ago on her left breast.  No pain no nipple discharge. She is able to perform all her ADLs without assistance.  She lives with the daughter.  She does use a walker. She hade an ultrasound and mammogram that  I have personally reviewed showing evidence of a mass at 1:00 within the left breast.  No evidence of axillary lymphadenopathy.  Biopsy shows invasive mammary carcinoma and receptors are pending at this time. Denies any previous breast biopsies. Her CMP is normal as well as her CBC  HPI  Past Medical History:  Diagnosis Date  . Depression   . Hyperlipidemia   . Hypertension     Past Surgical History:  Procedure Laterality Date  . APPENDECTOMY    . CATARACT EXTRACTION, BILATERAL    . CHOLECYSTECTOMY    . EYE SURGERY    . HYSTERECTOMY ABDOMINAL WITH SALPINGO-OOPHORECTOMY     benign reason  . LUMBAR DISC SURGERY      Family History  Problem Relation Age of Onset  . Breast cancer Mother 21  . CAD Father     Social History Social History   Tobacco Use  . Smoking status: Never Smoker  . Smokeless tobacco: Never Used  Substance Use Topics  . Alcohol use: Never    Frequency: Never  . Drug use: Never    No Known Allergies  Current Outpatient Medications  Medication Sig Dispense Refill  . BIOTIN 5000 PO Take by mouth daily.    . Cholecalciferol (VITAMIN D3) 1000 units CAPS Take by mouth.    . losartan (COZAAR) 25 MG tablet TAKE 1 TABLET DAILY. 90 tablet 1  . meloxicam (MOBIC) 7.5 MG tablet TAKE 1 TABLET DAILY. 90 tablet 1  . Multiple Vitamins-Minerals (ICAPS AREDS 2 PO) Take by mouth.    . Omega-3 Fatty Acids (FISH OIL PEARLS) 300 MG CAPS Take 300 mg by mouth daily.     .  pravastatin (PRAVACHOL) 40 MG tablet TAKE 1 TABLET DAILY. 90 tablet 1  . sertraline (ZOLOFT) 25 MG tablet TAKE 1 TABLET DAILY. 90 tablet 1  . sertraline (ZOLOFT) 50 MG tablet TAKE 1 TABLET DAILY. 90 tablet 1   No current facility-administered medications for this visit.      Review of Systems Full ROS  was asked and was negative except for the information on the HPI  Physical Exam Blood pressure 140/70, pulse 72, temperature 97.7 F (36.5 C), temperature source Skin, resp. rate 13, height 4\' 11"  (1.499 m), weight 111 lb (50.3 kg), SpO2 98 %. CONSTITUTIONAL: NAD EYES: Pupils are equal, round, and reactive to light, Sclera are non-icteric. EARS, NOSE, MOUTH AND THROAT: The oropharynx is clear. The oral mucosa is pink and moist. Hearing is intact to voice. LYMPH NODES:  Lymph nodes in the neck are normal. RESPIRATORY:  Lungs are clear. There is normal respiratory effort, with equal breath sounds bilaterally, and without pathologic use of accessory muscles. CARDIOVASCULAR: Heart is regular without murmurs, gallops, or rubs. BREAST : palpable 2.5 cm mass located 1 o'clock 6 cms from nipple. NO evidence of axillary LAD. GI: The abdomen is  soft, nontender, and nondistended. There are no  palpable masses. There is no hepatosplenomegaly. There are normal bowel sounds in all quadrants. GU: Rectal deferred.   MUSCULOSKELETAL: Normal muscle strength and tone. No cyanosis or edema.   SKIN: Turgor is good and there are no pathologic skin lesions or ulcers. NEUROLOGIC: Motor and sensation is grossly normal. Cranial nerves are grossly intact. PSYCH:  Oriented to person, place and time. Affect is normal.  Data Reviewed  I have personally reviewed the patient's imaging, laboratory findings and medical records.    Assessment/Plan Newly diagnosed left breast cancer.  I was the first provider to do a face-to-face interaction with the patient regarding her diagnosis.  I had a lengthy discussion with the  patient and the family about breast cancer.  Surgical therapy patient is including mastectomy versus lumpectomy radiation therapy with internal lymph node dissection discussed at length with the patient and the family. We will discuss the role for hormonal therapy but at this time we do not have any hormonal receptors. He wishes to think about it she also has an appointment with Dr. Grayland Ormond today to discuss medical options.  I do think that she will be a good candidate for either mastectomy or lumpectomy with radiation therapy.  I will see her back in a couple weeks once we have a more clear understanding about her decision process.   Time spent with the patient was 60 minutes, with more than 50% of the time spent in face-to-face education, counseling and care coordination.     Caroleen Hamman, MD FACS General Surgeon 04/23/2018, 4:18 PM

## 2018-04-23 NOTE — Progress Notes (Signed)
  Oncology Nurse Navigator Documentation  Navigator Location: [facility]-Med Onc (04/23/18 1100) Referral date to RadOnc/MedOnc: 04/23/18 (04/23/18 1100) )Navigator Encounter Type: Initial MedOnc (04/23/18 1100)                     Patient Visit Type: MedOnc (04/23/18 1100) Treatment Phase: Pre-Tx/Tx Discussion (04/23/18 1100) Barriers/Navigation Needs: Education (04/23/18 1100) Education: Newly Diagnosed Cancer Education (04/23/18 1100)          Support Groups/Services: Breast Support Group (04/23/18 1100)             Time Spent with Patient: 29 (04/23/18 1100)   Met patient and her daughter Christina Mccarthy during her initial medical oncology consult with Dr. Grayland Mccarthy.  Patient has met with Dr. Dahlia Byes earlier this morning for surgical consult.  Surgery is pending depending on her decision for lumpectomy or mastectomy.  ER/PR and Her2 are not back.  Patient to return to see Dr. Grayland Mccarthy 1-2 weeks after surgery.  Patient is to let me know her surgery date and I can arrange her next appointment.  She is to call with any questions or needs.

## 2018-04-23 NOTE — Patient Instructions (Signed)
Return in two weeks. The patient is aware to call back for any questions or concerns.  

## 2018-04-23 NOTE — Progress Notes (Signed)
Patient ID: Christina Mccarthy, female   DOB: 10/28/1929, 84 y.o.   MRN: [id number]  HPI Christina Mccarthy is a 84 y.o. female seen for a newly diagnosed  Left breast CA.  Menarche age 49, Hysterectomy and BSO age 45. No hormonal replacement. No fam hx breast CA. reports that she fell a mass about a month or so ago on her left breast.  No pain no nipple discharge. She is able to perform all her ADLs without assistance.  She lives with the daughter.  She does use a walker. She hade an ultrasound and mammogram that  I have personally reviewed showing evidence of a mass at 1:00 within the left breast.  No evidence of axillary lymphadenopathy.  Biopsy shows invasive mammary carcinoma and receptors are pending at this time. Denies any previous breast biopsies. Her CMP is normal as well as her CBC  HPI  Past Medical History:  Diagnosis Date  . Depression   . Hyperlipidemia   . Hypertension     Past Surgical History:  Procedure Laterality Date  . APPENDECTOMY    . CATARACT EXTRACTION, BILATERAL    . CHOLECYSTECTOMY    . EYE SURGERY    . HYSTERECTOMY ABDOMINAL WITH SALPINGO-OOPHORECTOMY     benign reason  . LUMBAR DISC SURGERY      Family History  Problem Relation Age of Onset  . Breast cancer Mother 66  . CAD Father     Social History Social History   Tobacco Use  . Smoking status: Never Smoker  . Smokeless tobacco: Never Used  Substance Use Topics  . Alcohol use: Never    Frequency: Never  . Drug use: Never    No Known Allergies  Current Outpatient Medications  Medication Sig Dispense Refill  . BIOTIN 5000 PO Take by mouth daily.    . Cholecalciferol (VITAMIN D3) 1000 units CAPS Take by mouth.    . losartan (COZAAR) 25 MG tablet TAKE 1 TABLET DAILY. 90 tablet 1  . meloxicam (MOBIC) 7.5 MG tablet TAKE 1 TABLET DAILY. 90 tablet 1  . Multiple Vitamins-Minerals (ICAPS AREDS 2 PO) Take by mouth.    . Omega-3 Fatty Acids (FISH OIL PEARLS) 300 MG CAPS Take 300 mg by mouth daily.     .  pravastatin (PRAVACHOL) 40 MG tablet TAKE 1 TABLET DAILY. 90 tablet 1  . sertraline (ZOLOFT) 25 MG tablet TAKE 1 TABLET DAILY. 90 tablet 1  . sertraline (ZOLOFT) 50 MG tablet TAKE 1 TABLET DAILY. 90 tablet 1   No current facility-administered medications for this visit.      Review of Systems Full ROS  was asked and was negative except for the information on the HPI  Physical Exam Blood pressure 140/70, pulse 72, temperature 97.7 F (36.5 C), temperature source Skin, resp. rate 13, height 4\' 11"  (1.499 m), weight 111 lb (50.3 kg), SpO2 98 %. CONSTITUTIONAL: NAD EYES: Pupils are equal, round, and reactive to light, Sclera are non-icteric. EARS, NOSE, MOUTH AND THROAT: The oropharynx is clear. The oral mucosa is pink and moist. Hearing is intact to voice. LYMPH NODES:  Lymph nodes in the neck are normal. RESPIRATORY:  Lungs are clear. There is normal respiratory effort, with equal breath sounds bilaterally, and without pathologic use of accessory muscles. CARDIOVASCULAR: Heart is regular without murmurs, gallops, or rubs. BREAST : palpable 2.5 cm mass located 1 o'clock 6 cms from nipple. NO evidence of axillary LAD. GI: The abdomen is  soft, nontender, and nondistended. There are no  palpable masses. There is no hepatosplenomegaly. There are normal bowel sounds in all quadrants. GU: Rectal deferred.   MUSCULOSKELETAL: Normal muscle strength and tone. No cyanosis or edema.   SKIN: Turgor is good and there are no pathologic skin lesions or ulcers. NEUROLOGIC: Motor and sensation is grossly normal. Cranial nerves are grossly intact. PSYCH:  Oriented to person, place and time. Affect is normal.  Data Reviewed  I have personally reviewed the patient's imaging, laboratory findings and medical records.    Assessment/Plan Newly diagnosed left breast cancer.  I was the first provider to do a face-to-face interaction with the patient regarding her diagnosis.  I had a lengthy discussion with the  patient and the family about breast cancer.  Surgical therapy patient is including mastectomy versus lumpectomy radiation therapy with internal lymph node dissection discussed at length with the patient and the family. We will discuss the role for hormonal therapy but at this time we do not have any hormonal receptors. He wishes to think about it she also has an appointment with Dr. Grayland Ormond today to discuss medical options.  I do think that she will be a good candidate for either mastectomy or lumpectomy with radiation therapy.  I will see her back in a couple weeks once we have a more clear understanding about her decision process.   Time spent with the patient was 60 minutes, with more than 50% of the time spent in face-to-face education, counseling and care coordination.     Caroleen Hamman, MD FACS General Surgeon 04/23/2018, 4:18 PM

## 2018-05-04 ENCOUNTER — Other Ambulatory Visit: Payer: Self-pay | Admitting: *Deleted

## 2018-05-09 ENCOUNTER — Encounter: Payer: Self-pay | Admitting: Surgery

## 2018-05-09 ENCOUNTER — Other Ambulatory Visit: Payer: Self-pay

## 2018-05-09 ENCOUNTER — Ambulatory Visit (INDEPENDENT_AMBULATORY_CARE_PROVIDER_SITE_OTHER): Payer: Medicare PPO | Admitting: Surgery

## 2018-05-09 ENCOUNTER — Encounter: Payer: Self-pay | Admitting: *Deleted

## 2018-05-09 VITALS — BP 122/76 | HR 69 | Temp 97.2°F | Resp 16 | Ht <= 58 in | Wt 111.0 lb

## 2018-05-09 DIAGNOSIS — C50412 Malignant neoplasm of upper-outer quadrant of left female breast: Secondary | ICD-10-CM

## 2018-05-09 DIAGNOSIS — Z170 Estrogen receptor positive status [ER+]: Secondary | ICD-10-CM | POA: Diagnosis not present

## 2018-05-09 NOTE — Patient Instructions (Signed)
The patient is aware to call back for any questions or concerns.  Total or Modified Radical Mastectomy, Care After This sheet gives you information about how to care for yourself after your procedure. Your health care provider may also give you more specific instructions. If you have problems or questions, contact your health care provider. What can I expect after the procedure? After the procedure, it is common to have:  Pain.  Numbness.  Stiffness in the arm or shoulder.  Feelings of stress, sadness, or depression. If the lymph nodes under your arm were removed, you may have arm swelling, weakness, or numbness on the same side of your body as your surgery. Follow these instructions at home: Incision care   Follow instructions from your health care provider about how to take care of your incision. Make sure you: ? Wash your hands with soap and water before you change your bandage (dressing). If soap and water are not available, use hand sanitizer. ? Change your dressing as told by your health care provider. ? Leave stitches (sutures), skin glue, or adhesive strips in place. These skin closures may need to stay in place for 2 weeks or longer. If adhesive strip edges start to loosen and curl up, you may trim the loose edges. Do not remove adhesive strips completely unless your health care provider tells you to do that.  Check your incision area every day for signs of infection. Check for: ? Redness, swelling, or more pain. ? Fluid or blood. ? Warmth. ? Pus or a bad smell.  If you were sent home with a surgical drain in place, follow instructions from your health care provider about emptying it. Bathing  Do not take baths, swim, or use a hot tub until your health care provider approves. Ask your health care provider if you may take showers. You may only be allowed to take sponge baths. Activity  Return to your normal activities as told by your health care provider. Ask your health  care provider what activities are safe for you.  Avoid activities that take a lot of effort.  Be careful to avoid any activities that could cause an injury to your arm on the side of your surgery.  Do not lift anything that is heavier than 10 lb (4.5 kg), or the limit that you are told, until your health care provider says that it is safe.  Avoid lifting with the arm on the side of your surgery.  Do not carry heavy objects on your shoulder.  After your drain is removed, do exercises to prevent stiffness and swelling in your arm. Talk with your health care provider about which exercises are safe for you. General instructions  Take over-the-counter and prescription medicines only as told by your health care provider.  You may eat what you usually do.  Keep your arm raised (elevated) above the level of your heart when you are sitting or lying down.  Do not wear tight jewelry on your arm, wrist, or fingers on the side of your surgery.  You may be given a tight sleeve (compression bandage) to wear over your arm on the side of your surgery. Wear this sleeve as told by your health care provider.  Ask your health care provider when you can start wearing a bra or using a breast prosthesis.  Before you are involved in certain procedures such as giving blood or having your blood pressure checked, tell all your health care providers if lymph nodes under your  arm were removed. This is important information. Follow-up  Keep all follow-up visits as told by your health care provider. This is important.  Get checked for extra fluid around your lymph nodes (lymphedema) as often as told by your health care provider. Contact a health care provider if:  You have a fever.  Your pain medicine is not working.  Your arm swelling, weakness, or numbness has not improved after a few weeks.  You have new swelling in your breast area or arm.  You have redness, swelling, or more pain in your incision  area.  You have fluid or blood coming from your incision.  Your incision feels warm to the touch.  You have pus or a bad smell coming from your incision. Get help right away if:  You have very bad pain in your breast area or arm.  You have chest pain.  You have difficulty breathing. Summary  Follow instructions from your health care provider about how to take care of your incision. Check your incision area every day for signs of infection.  Ask your health care provider what activities are safe for you.  Keep all follow-up visits as told by your health care provider. This is important.  Make sure you know which symptoms should cause you to contact your health care provider or to get help right away. This information is not intended to replace advice given to you by your health care provider. Make sure you discuss any questions you have with your health care provider. Document Released: 12/10/2003 Document Revised: 01/20/2017 Document Reviewed: 01/20/2017 Elsevier Interactive Patient Education  2019 [employer].

## 2018-05-09 NOTE — Progress Notes (Signed)
Patient's surgery has been scheduled for 05-17-18 at [facility] with Dr. Dahlia Byes.   The patient is aware she will need to  Pre-Admit. Patient will check in at the [place], [address] (first floor). *Patient will be contacted tomorrow to notify her of date and time.   *The Nuclear Medicine Department has already gone home for the day. SLN will be scheduled tomorrow and patient will be contacted to notify her of arrival time day of surgery.

## 2018-05-10 ENCOUNTER — Telehealth: Payer: Self-pay | Admitting: *Deleted

## 2018-05-10 ENCOUNTER — Encounter: Payer: Self-pay | Admitting: *Deleted

## 2018-05-10 NOTE — Progress Notes (Signed)
  Oncology Nurse Navigator Documentation  Navigator Location: [facility] (05/10/18 1500)   )Navigator Encounter Type: Telephone (05/10/18 1500) Telephone: Incoming Call (05/10/18 1500)     Surgery Date: 05/16/18 (05/10/18 1500)                 Barriers/Navigation Needs: Coordination of Care (05/10/18 1500)   Interventions: Coordination of Care (05/10/18 1500)   Coordination of Care: Appts (05/10/18 1500)                  Time Spent with Patient: 15 (05/10/18 1500)   Patients daughter Christina Mccarthy called to let me know patients surgery date and to get her follow-up appointment with Dr. Grayland Ormond.  Patient scheduled to see Dr. Grayland Ormond on 05/29/18 @ 10:15.

## 2018-05-10 NOTE — Telephone Encounter (Signed)
Patient's daughter contacted today and surgery date for 05-17-18 was confirmed. She is aware to have patient arrive at 7:45 am (SLN scheduled for 8 am). Patient will check in at the radiology desk in the [place] day of surgery.   The patient's daughter was also notified of pre-admit appointment scheduled for 05-14-18 at 1 pm.   She was instructed to call the office should they have further questions.

## 2018-05-10 NOTE — Progress Notes (Signed)
Outpatient Surgical Follow Up  05/10/2018  Christina Mccarthy is an 85 y.o. female.   Chief Complaint  Patient presents with  . Follow-up    2 week f/u breast cancer, discuss surgery    HPI: 85-year-old female recently diagnosed with left base of mammary carcinoma.  Her PR positive and HER-2 negative. She  Has already seen medical oncology by Dr. Finnegan.  Now comes for further discussion of surgical intervention.  I again explained to her the different options of breast conservation versus mastectomy.  Past Medical History:  Diagnosis Date  . Depression   . Hyperlipidemia   . Hypertension     Past Surgical History:  Procedure Laterality Date  . APPENDECTOMY    . CATARACT EXTRACTION, BILATERAL    . CHOLECYSTECTOMY    . EYE SURGERY    . HYSTERECTOMY ABDOMINAL WITH SALPINGO-OOPHORECTOMY     benign reason  . LUMBAR DISC SURGERY      Family History  Problem Relation Age of Onset  . Breast cancer Mother 50  . CAD Father     Social History:  reports that she has never smoked. She has never used smokeless tobacco. She reports that she does not drink alcohol or use drugs.  Allergies: No Known Allergies  Medications reviewed.    ROS Full ROS performed and is otherwise negative other than what is stated in HPI   BP 122/76   Pulse 69   Temp (!) 97.2 F (36.2 C) (Skin)   Resp 16   Ht 4' 10" (1.473 m)   Wt 111 lb (50.3 kg)   SpO2 99%   BMI 23.20 kg/m   Physical Exam CONSTITUTIONAL: NAD EYES: Pupils are equal, round, and reactive to light, Sclera are non-icteric. EARS, NOSE, MOUTH AND THROAT: The oropharynx is clear. The oral mucosa is pink and moist. Hearing is intact to voice. LYMPH NODES:  Lymph nodes in the neck are normal. RESPIRATORY:  Lungs are clear. There is normal respiratory effort, with equal breath sounds bilaterally, and without pathologic use of accessory muscles. CARDIOVASCULAR: Heart is regular without murmurs, gallops, or rubs. BREAST : LEFT palpable  2.5 cm mass located 1 o'clock 6 cms from nipple. NO evidence of axillary LAD. GI: The abdomen is  soft, nontender, and nondistended. There are no palpable masses. There is no hepatosplenomegaly. There are normal bowel sounds in all quadrants. GU: Rectal deferred.   MUSCULOSKELETAL: Normal muscle strength and tone. No cyanosis or edema.   SKIN: Turgor is good and there are no pathologic skin lesions or ulcers. NEUROLOGIC: Motor and sensation is grossly normal. Cranial nerves are grossly intact. PSYCH:  Oriented to person, place and time. Affect is normal.   Assessment/Plan: LEfT Breast CA. I had a lengthy discussion with her and her daughter.  Options of breast conservation versus mastectomy where explained to the patient in detail.  At this time she is adamant that she wishes to have a mastectomy.  With that in mind we will proceed with simple mastectomy and sentinel lymph node biopsy.  Procedure discussed with the patient in detail.  Risk benefits and possible complications including but not limited to: Bleeding, infection, necrosis of flaps, seroma and chronic pain.  Risk of lymphedema discussed with the patient detail.  They understand and wish to proceed  I spent a total of 25 minutes in this encounter with greater than 50% spent in coordination and counseling of her care  Diego Pabon, MD FACS General Surgeon 

## 2018-05-14 ENCOUNTER — Encounter
Admission: RE | Admit: 2018-05-14 | Discharge: 2018-05-14 | Disposition: A | Discharge status: Home / Self Care | Payer: Medicare PPO | Source: Ambulatory Visit | Attending: Surgery | Admitting: Surgery

## 2018-05-14 ENCOUNTER — Other Ambulatory Visit: Payer: Self-pay

## 2018-05-14 DIAGNOSIS — Z01818 Encounter for other preprocedural examination: Secondary | ICD-10-CM

## 2018-05-14 DIAGNOSIS — F329 Major depressive disorder, single episode, unspecified: Secondary | ICD-10-CM | POA: Diagnosis not present

## 2018-05-14 DIAGNOSIS — E785 Hyperlipidemia, unspecified: Secondary | ICD-10-CM | POA: Diagnosis not present

## 2018-05-14 DIAGNOSIS — I10 Essential (primary) hypertension: Secondary | ICD-10-CM

## 2018-05-14 DIAGNOSIS — Z9842 Cataract extraction status, left eye: Secondary | ICD-10-CM | POA: Diagnosis not present

## 2018-05-14 DIAGNOSIS — Z8249 Family history of ischemic heart disease and other diseases of the circulatory system: Secondary | ICD-10-CM | POA: Diagnosis not present

## 2018-05-14 DIAGNOSIS — Z90710 Acquired absence of both cervix and uterus: Secondary | ICD-10-CM | POA: Diagnosis not present

## 2018-05-14 DIAGNOSIS — Z791 Long term (current) use of non-steroidal anti-inflammatories (NSAID): Secondary | ICD-10-CM | POA: Diagnosis not present

## 2018-05-14 DIAGNOSIS — Z9841 Cataract extraction status, right eye: Secondary | ICD-10-CM | POA: Diagnosis not present

## 2018-05-14 DIAGNOSIS — M1990 Unspecified osteoarthritis, unspecified site: Secondary | ICD-10-CM | POA: Diagnosis not present

## 2018-05-14 DIAGNOSIS — C50912 Malignant neoplasm of unspecified site of left female breast: Secondary | ICD-10-CM | POA: Diagnosis not present

## 2018-05-14 DIAGNOSIS — Z79899 Other long term (current) drug therapy: Secondary | ICD-10-CM | POA: Diagnosis not present

## 2018-05-14 DIAGNOSIS — Z803 Family history of malignant neoplasm of breast: Secondary | ICD-10-CM | POA: Diagnosis not present

## 2018-05-14 HISTORY — DX: Malignant (primary) neoplasm, unspecified: C80.1

## 2018-05-14 NOTE — Patient Instructions (Signed)
Your procedure is scheduled on: Thursday, May 17, 2018 Report to the Nuclear Medicine department at 7:45 am  REMEMBER: Instructions that are not followed completely may result in serious medical risk, up to and including death; or upon the discretion of your surgeon and anesthesiologist your surgery may need to be rescheduled.  Do not eat food after midnight the night before surgery.  No gum chewing, lozengers or hard candies.  You may however, drink CLEAR liquids up to 2 hours before you are scheduled to arrive for your surgery. Do not drink anything within 2 hours of the start of your surgery.  Clear liquids include: - water  - apple juice without pulp - gatorade - black coffee or tea (Do NOT add milk or creamers to the coffee or tea) Do NOT drink anything that is not on this list.  No Alcohol for 24 hours before or after surgery.  No Smoking including e-cigarettes for 24 hours prior to surgery.  No chewable tobacco products for at least 6 hours prior to surgery.  No nicotine patches on the day of surgery.  On the morning of surgery brush your teeth with toothpaste and water, you may rinse your mouth with mouthwash if you wish. Do not swallow any toothpaste or mouthwash.  Notify your doctor if there is any change in your medical condition (cold, fever, infection).  Do not wear jewelry, make-up, hairpins, clips or nail polish.  Do not wear lotions, powders, or perfumes.   Do not shave 48 hours prior to surgery.   Contacts and dentures may not be worn into surgery.  Do not bring valuables to the hospital, including drivers license, insurance or credit cards.  [facility] is not responsible for any belongings or valuables.   TAKE THESE MEDICATIONS THE MORNING OF SURGERY:  1.  Sertraline  Use CHG Soap as directed on instruction sheet.  NOW!  Stop MELOXICAM and Anti-inflammatories (NSAIDS) such as Advil, Aleve, Ibuprofen, Motrin, Naproxen, Naprosyn and Aspirin based  products such as Excedrin, Goodys Powder, BC Powder. (May take Tylenol or Acetaminophen if needed.)  NOW!  Stop ANY OVER THE COUNTER supplements until after surgery. (HAIR,SKIN,NAILS - FISH OIL) (May continue multivitamin.)  Plan for stool softeners for home use.  If you are being admitted to the hospital overnight, leave your suitcase in the car. After surgery it may be brought to your room.  If you are being discharged the day of surgery, you will not be allowed to drive home. You will need a responsible adult to drive you home and stay with you that night.   If you are taking public transportation, you will need to have a responsible adult with you. Please confirm with your physician that it is acceptable to use public transportation.   Please call [phone] if you have any questions about these instructions.

## 2018-05-15 ENCOUNTER — Other Ambulatory Visit: Payer: Self-pay | Admitting: *Deleted

## 2018-05-15 DIAGNOSIS — C50412 Malignant neoplasm of upper-outer quadrant of left female breast: Secondary | ICD-10-CM

## 2018-05-15 DIAGNOSIS — Z170 Estrogen receptor positive status [ER+]: Principal | ICD-10-CM

## 2018-05-16 MED ORDER — CEFAZOLIN SODIUM-DEXTROSE 2-4 GM/100ML-% IV SOLN
2.0000 g | INTRAVENOUS | Status: AC
  Administered 2018-05-17: 2 g via INTRAVENOUS

## 2018-05-17 ENCOUNTER — Other Ambulatory Visit: Payer: Self-pay

## 2018-05-17 ENCOUNTER — Ambulatory Visit: Payer: Medicare PPO | Admitting: Anesthesiology

## 2018-05-17 ENCOUNTER — Observation Stay
Admission: RE | Admit: 2018-05-17 | Discharge: 2018-05-18 | Disposition: A | Discharge status: Home / Self Care | Payer: Medicare PPO | Attending: Surgery | Admitting: Surgery

## 2018-05-17 ENCOUNTER — Encounter
Admission: RE | Disposition: A | Discharge status: Home / Self Care | Payer: Self-pay | Source: Home / Self Care | Attending: Surgery

## 2018-05-17 ENCOUNTER — Encounter: Payer: Self-pay | Admitting: *Deleted

## 2018-05-17 ENCOUNTER — Ambulatory Visit
Admission: RE | Admit: 2018-05-17 | Discharge: 2018-05-17 | Disposition: A | Discharge status: Home / Self Care | Payer: Medicare PPO | Source: Ambulatory Visit | Attending: Surgery | Admitting: Surgery

## 2018-05-17 DIAGNOSIS — E785 Hyperlipidemia, unspecified: Secondary | ICD-10-CM | POA: Diagnosis not present

## 2018-05-17 DIAGNOSIS — C50412 Malignant neoplasm of upper-outer quadrant of left female breast: Secondary | ICD-10-CM

## 2018-05-17 DIAGNOSIS — Z9841 Cataract extraction status, right eye: Secondary | ICD-10-CM | POA: Diagnosis not present

## 2018-05-17 DIAGNOSIS — F329 Major depressive disorder, single episode, unspecified: Secondary | ICD-10-CM | POA: Diagnosis not present

## 2018-05-17 DIAGNOSIS — Z9842 Cataract extraction status, left eye: Secondary | ICD-10-CM | POA: Diagnosis not present

## 2018-05-17 DIAGNOSIS — Z8249 Family history of ischemic heart disease and other diseases of the circulatory system: Secondary | ICD-10-CM | POA: Insufficient documentation

## 2018-05-17 DIAGNOSIS — I10 Essential (primary) hypertension: Secondary | ICD-10-CM | POA: Insufficient documentation

## 2018-05-17 DIAGNOSIS — C50912 Malignant neoplasm of unspecified site of left female breast: Principal | ICD-10-CM | POA: Insufficient documentation

## 2018-05-17 DIAGNOSIS — Z791 Long term (current) use of non-steroidal anti-inflammatories (NSAID): Secondary | ICD-10-CM | POA: Insufficient documentation

## 2018-05-17 DIAGNOSIS — Z170 Estrogen receptor positive status [ER+]: Secondary | ICD-10-CM | POA: Diagnosis not present

## 2018-05-17 DIAGNOSIS — Z79899 Other long term (current) drug therapy: Secondary | ICD-10-CM | POA: Insufficient documentation

## 2018-05-17 DIAGNOSIS — M1990 Unspecified osteoarthritis, unspecified site: Secondary | ICD-10-CM | POA: Insufficient documentation

## 2018-05-17 DIAGNOSIS — Z90710 Acquired absence of both cervix and uterus: Secondary | ICD-10-CM | POA: Insufficient documentation

## 2018-05-17 DIAGNOSIS — Z803 Family history of malignant neoplasm of breast: Secondary | ICD-10-CM | POA: Insufficient documentation

## 2018-05-17 HISTORY — PX: MASTECTOMY: SHX3

## 2018-05-17 HISTORY — PX: MASTECTOMY W/ SENTINEL NODE BIOPSY: SHX2001

## 2018-05-17 LAB — CREATININE, SERUM
Creatinine, Ser: 0.8 mg/dL (ref 0.44–1.00)
GFR calc non Af Amer: >60 mL/min (ref >60)

## 2018-05-17 LAB — CBC
HEMATOCRIT: 43.5 % (ref 36.0–46.0)
Hemoglobin: 14.2 g/dL (ref 12.0–15.0)
MCH: 29.3 pg (ref 26.0–34.0)
MCHC: 32.6 g/dL (ref 30.0–36.0)
MCV: 89.9 fL (ref 80.0–100.0)
Platelets: 168 10*3/uL (ref 150–400)
RBC: 4.84 MIL/uL (ref 3.87–5.11)
RDW: 12.8 % (ref 11.5–15.5)
WBC: 13.2 10*3/uL — ABNORMAL HIGH (ref 4.0–10.5)
nRBC: 0 % (ref 0.0–0.2)

## 2018-05-17 SURGERY — MASTECTOMY WITH SENTINEL LYMPH NODE BIOPSY
Anesthesia: General | Laterality: Left

## 2018-05-17 MED ORDER — PROCHLORPERAZINE MALEATE 10 MG PO TABS
10.0000 mg | ORAL_TABLET | Freq: Four times a day (QID) | ORAL | Status: DC | PRN
  Filled 2018-05-17: qty 1

## 2018-05-17 MED ORDER — KETOROLAC TROMETHAMINE 15 MG/ML IJ SOLN
15.0000 mg | Freq: Four times a day (QID) | INTRAMUSCULAR | Status: DC | PRN
  Administered 2018-05-17: 15 mg via INTRAVENOUS
  Filled 2018-05-17: qty 1

## 2018-05-17 MED ORDER — FAMOTIDINE 20 MG PO TABS
ORAL_TABLET | ORAL | Status: AC
  Administered 2018-05-17: 20 mg via ORAL
  Filled 2018-05-17: qty 1

## 2018-05-17 MED ORDER — EPHEDRINE SULFATE 50 MG/ML IJ SOLN
INTRAMUSCULAR | Status: DC | PRN
  Administered 2018-05-17: 10 mg via INTRAVENOUS

## 2018-05-17 MED ORDER — KETOROLAC TROMETHAMINE 15 MG/ML IJ SOLN
INTRAMUSCULAR | Status: AC
  Filled 2018-05-17: qty 1

## 2018-05-17 MED ORDER — FENTANYL CITRATE (PF) 100 MCG/2ML IJ SOLN
INTRAMUSCULAR | Status: AC
  Administered 2018-05-17: 25 ug via INTRAVENOUS
  Filled 2018-05-17: qty 2

## 2018-05-17 MED ORDER — ONDANSETRON HCL 4 MG/2ML IJ SOLN
INTRAMUSCULAR | Status: AC
  Filled 2018-05-17: qty 6

## 2018-05-17 MED ORDER — ONDANSETRON 4 MG PO TBDP: 4.0000 mg | ORAL_TABLET | Freq: Four times a day (QID) | ORAL | Status: DC | PRN

## 2018-05-17 MED ORDER — MORPHINE SULFATE (PF) 4 MG/ML IV SOLN: 1.0000 mg | INTRAVENOUS | Status: DC | PRN

## 2018-05-17 MED ORDER — FENTANYL CITRATE (PF) 100 MCG/2ML IJ SOLN
INTRAMUSCULAR | Status: DC | PRN
  Administered 2018-05-17 (×3): 25 ug via INTRAVENOUS
  Administered 2018-05-17 (×2): 50 ug via INTRAVENOUS

## 2018-05-17 MED ORDER — ISOSULFAN BLUE 1 % ~~LOC~~ SOLN
SUBCUTANEOUS | Status: AC
  Filled 2018-05-17: qty 5

## 2018-05-17 MED ORDER — ACETAMINOPHEN 500 MG PO TABS
1000.0000 mg | ORAL_TABLET | ORAL | Status: AC
  Administered 2018-05-17: 1000 mg via ORAL

## 2018-05-17 MED ORDER — FENTANYL CITRATE (PF) 100 MCG/2ML IJ SOLN
INTRAMUSCULAR | Status: AC
  Filled 2018-05-17: qty 2

## 2018-05-17 MED ORDER — PROPOFOL 10 MG/ML IV BOLUS
INTRAVENOUS | Status: AC
  Filled 2018-05-17: qty 100

## 2018-05-17 MED ORDER — CHLORHEXIDINE GLUCONATE CLOTH 2 % EX PADS
6.0000 | MEDICATED_PAD | Freq: Once | CUTANEOUS | Status: AC
  Administered 2018-05-17: 6 via TOPICAL

## 2018-05-17 MED ORDER — PROPOFOL 10 MG/ML IV BOLUS
INTRAVENOUS | Status: DC | PRN
  Administered 2018-05-17 (×2): 30 mg via INTRAVENOUS
  Administered 2018-05-17: 70 mg via INTRAVENOUS

## 2018-05-17 MED ORDER — LACTATED RINGERS IV SOLN
INTRAVENOUS | Status: DC
  Administered 2018-05-17: 16:00:00 via INTRAVENOUS

## 2018-05-17 MED ORDER — OXYCODONE HCL 5 MG PO TABS
ORAL_TABLET | ORAL | Status: AC
  Administered 2018-05-17: 5 mg via ORAL
  Filled 2018-05-17: qty 1

## 2018-05-17 MED ORDER — HEPARIN SODIUM (PORCINE) 5000 UNIT/ML IJ SOLN
5000.0000 [IU] | Freq: Three times a day (TID) | INTRAMUSCULAR | Status: DC
  Administered 2018-05-17 – 2018-05-18 (×2): 5000 [IU] via SUBCUTANEOUS
  Filled 2018-05-17 (×2): qty 1

## 2018-05-17 MED ORDER — GABAPENTIN 300 MG PO CAPS
ORAL_CAPSULE | ORAL | Status: AC
  Administered 2018-05-17: 300 mg via ORAL
  Filled 2018-05-17: qty 1

## 2018-05-17 MED ORDER — ONDANSETRON HCL 4 MG/2ML IJ SOLN: 4.0000 mg | Freq: Once | INTRAMUSCULAR | Status: DC | PRN

## 2018-05-17 MED ORDER — ACETAMINOPHEN 500 MG PO TABS
1000.0000 mg | ORAL_TABLET | Freq: Four times a day (QID) | ORAL | Status: DC
  Administered 2018-05-17 – 2018-05-18 (×3): 1000 mg via ORAL
  Filled 2018-05-17 (×3): qty 2

## 2018-05-17 MED ORDER — FAMOTIDINE 20 MG PO TABS
20.0000 mg | ORAL_TABLET | Freq: Once | ORAL | Status: AC
  Administered 2018-05-17: 20 mg via ORAL

## 2018-05-17 MED ORDER — HYDRALAZINE HCL 20 MG/ML IJ SOLN
10.0000 mg | INTRAMUSCULAR | Status: DC | PRN
  Filled 2018-05-17: qty 0.5

## 2018-05-17 MED ORDER — FENTANYL CITRATE (PF) 100 MCG/2ML IJ SOLN
25.0000 ug | INTRAMUSCULAR | Status: DC | PRN
  Administered 2018-05-17 (×3): 25 ug via INTRAVENOUS

## 2018-05-17 MED ORDER — PANTOPRAZOLE SODIUM 40 MG IV SOLR
40.0000 mg | Freq: Every day | INTRAVENOUS | Status: DC
  Administered 2018-05-17: 40 mg via INTRAVENOUS
  Filled 2018-05-17: qty 40

## 2018-05-17 MED ORDER — CHLORHEXIDINE GLUCONATE CLOTH 2 % EX PADS: 6.0000 | MEDICATED_PAD | Freq: Once | CUTANEOUS | Status: DC

## 2018-05-17 MED ORDER — OXYCODONE HCL 5 MG PO TABS
5.0000 mg | ORAL_TABLET | ORAL | Status: DC | PRN
  Filled 2018-05-17: qty 2

## 2018-05-17 MED ORDER — ONDANSETRON HCL 4 MG/2ML IJ SOLN
INTRAMUSCULAR | Status: DC | PRN
  Administered 2018-05-17: 4 mg via INTRAVENOUS

## 2018-05-17 MED ORDER — ONDANSETRON HCL 4 MG/2ML IJ SOLN: 4.0000 mg | Freq: Four times a day (QID) | INTRAMUSCULAR | Status: DC | PRN

## 2018-05-17 MED ORDER — CEFAZOLIN SODIUM-DEXTROSE 2-4 GM/100ML-% IV SOLN
INTRAVENOUS | Status: AC
  Filled 2018-05-17: qty 100

## 2018-05-17 MED ORDER — LACTATED RINGERS IV SOLN
INTRAVENOUS | Status: DC
  Administered 2018-05-17: 09:00:00 via INTRAVENOUS

## 2018-05-17 MED ORDER — PROCHLORPERAZINE EDISYLATE 10 MG/2ML IJ SOLN
5.0000 mg | Freq: Four times a day (QID) | INTRAMUSCULAR | Status: DC | PRN
  Filled 2018-05-17: qty 2

## 2018-05-17 MED ORDER — TECHNETIUM TC 99M SULFUR COLLOID FILTERED
1.0000 | Freq: Once | INTRAVENOUS | Status: AC | PRN
  Administered 2018-05-17: 0.824 via INTRADERMAL

## 2018-05-17 MED ORDER — LIDOCAINE HCL (CARDIAC) PF 100 MG/5ML IV SOSY
PREFILLED_SYRINGE | INTRAVENOUS | Status: DC | PRN
  Administered 2018-05-17: 50 mg via INTRAVENOUS

## 2018-05-17 MED ORDER — OXYCODONE HCL 5 MG/5ML PO SOLN: 5.0000 mg | Freq: Once | ORAL | Status: AC | PRN

## 2018-05-17 MED ORDER — ISOSULFAN BLUE 1 % ~~LOC~~ SOLN
SUBCUTANEOUS | Status: DC | PRN
  Administered 2018-05-17: 5 mL via SUBCUTANEOUS

## 2018-05-17 MED ORDER — GABAPENTIN 300 MG PO CAPS
300.0000 mg | ORAL_CAPSULE | ORAL | Status: AC
  Administered 2018-05-17: 300 mg via ORAL

## 2018-05-17 MED ORDER — SERTRALINE HCL 25 MG PO TABS
25.0000 mg | ORAL_TABLET | Freq: Every day | ORAL | Status: DC
  Administered 2018-05-17 – 2018-05-18 (×2): 25 mg via ORAL
  Filled 2018-05-17 (×2): qty 1

## 2018-05-17 MED ORDER — OXYCODONE HCL 5 MG PO TABS
5.0000 mg | ORAL_TABLET | Freq: Once | ORAL | Status: AC | PRN
  Administered 2018-05-17: 5 mg via ORAL

## 2018-05-17 MED ORDER — ACETAMINOPHEN 500 MG PO TABS
ORAL_TABLET | ORAL | Status: AC
  Administered 2018-05-17: 1000 mg via ORAL
  Filled 2018-05-17: qty 2

## 2018-05-17 SURGICAL SUPPLY — 43 items
APPLIER CLIP 9.375 SM OPEN (CLIP) ×6
BINDER BREAST MEDIUM (GAUZE/BANDAGES/DRESSINGS) ×2 IMPLANT
BLADE SURG 15 STRL LF DISP TIS (BLADE) ×1 IMPLANT
BLADE SURG 15 STRL SS (BLADE) ×2
BULB RESERV EVAC DRAIN JP 100C (MISCELLANEOUS) ×2 IMPLANT
CANISTER SUCT 1200ML W/VALVE (MISCELLANEOUS) ×3 IMPLANT
CHLORAPREP W/TINT 26ML (MISCELLANEOUS) ×3 IMPLANT
CLIP APPLIE 9.375 SM OPEN (CLIP) ×1 IMPLANT
CNTNR SPEC 2.5X3XGRAD LEK (MISCELLANEOUS) ×4
CONT SPEC 4OZ STER OR WHT (MISCELLANEOUS) ×8
CONTAINER SPEC 2.5X3XGRAD LEK (MISCELLANEOUS) ×4 IMPLANT
COVER WAND RF STERILE (DRAPES) ×1 IMPLANT
DERMABOND ADVANCED (GAUZE/BANDAGES/DRESSINGS) ×2
DERMABOND ADVANCED .7 DNX12 (GAUZE/BANDAGES/DRESSINGS) ×1 IMPLANT
DRAIN CHANNEL JP 15F RND 16 (MISCELLANEOUS) ×2 IMPLANT
DRAPE INCISE IOBAN 66X45 STRL (DRAPES) ×2 IMPLANT
DRAPE LAPAROTOMY TRNSV 106X77 (MISCELLANEOUS) ×3 IMPLANT
DRSG GAUZE FLUFF 36X18 (GAUZE/BANDAGES/DRESSINGS) ×2 IMPLANT
ELECT CAUTERY BLADE 6.4 (BLADE) ×3 IMPLANT
ELECT REM PT RETURN 9FT ADLT (ELECTROSURGICAL) ×3
ELECTRODE REM PT RTRN 9FT ADLT (ELECTROSURGICAL) ×1 IMPLANT
GLOVE BIO SURGEON STRL SZ 6 (GLOVE) ×2 IMPLANT
GLOVE BIO SURGEON STRL SZ7 (GLOVE) ×3 IMPLANT
GOWN STRL REUS W/ TWL LRG LVL3 (GOWN DISPOSABLE) ×2 IMPLANT
GOWN STRL REUS W/TWL LRG LVL3 (GOWN DISPOSABLE) ×6
KIT TURNOVER KIT A (KITS) ×3 IMPLANT
LABEL OR SOLS (LABEL) ×3 IMPLANT
MARGIN MAP 10MM (MISCELLANEOUS) ×2 IMPLANT
NDL FILTER BLUNT 18X1 1/2 (NEEDLE) ×1 IMPLANT
NEEDLE FILTER BLUNT 18X 1/2SAF (NEEDLE) ×2
NEEDLE FILTER BLUNT 18X1 1/2 (NEEDLE) ×1 IMPLANT
NEEDLE HYPO 22GX1.5 SAFETY (NEEDLE) ×6 IMPLANT
PACK BASIN MINOR ARMC (MISCELLANEOUS) ×3 IMPLANT
SLEVE PROBE SENORX GAMMA FIND (MISCELLANEOUS) ×3 IMPLANT
SPONGE LAP 18X18 RF (DISPOSABLE) ×3 IMPLANT
SUT ETH BLK MONO 3 0 FS 1 12/B (SUTURE) ×2 IMPLANT
SUT MNCRL 4-0 (SUTURE) ×2
SUT MNCRL 4-0 27XMFL (SUTURE) ×1
SUT SILK 2 0 (SUTURE) ×2
SUT SILK 2-0 18XBRD TIE 12 (SUTURE) IMPLANT
SUT VIC AB 2-0 CT1 (SUTURE) ×12 IMPLANT
SUTURE MNCRL 4-0 27XMF (SUTURE) ×1 IMPLANT
SYR 10ML LL (SYRINGE) ×3 IMPLANT

## 2018-05-17 NOTE — Op Note (Signed)
  Pre-operative Diagnosis: Invasive Left Breast Cancer     Post-operative Diagnosis: Same   Surgeon: Caroleen Hamman, MD FACS  Anesthesia: GETA  Procedure: left simple mastectomy and  sentinel node biopsy   Findings: Three lymph nodes both blue and hot  Estimated Blood Loss: Minimal         Drains: None         Specimens:  mastectomy with labels, SLN       Complications: none                 Condition: Stable    Procedure Details  The patient was seen again in the Holding Room. The benefits, complications, treatment options, and expected outcomes were discussed with the patient. The risks of bleeding, infection, recurrence of symptoms, failure to resolve symptoms, hematoma, seroma, open wound, cosmetic deformity, and the need for further surgery were discussed.  The patient was taken to Operating Room, identified as Christina Mccarthy and the procedure verified.  A Time Out was held and the above information confirmed.  Prior to the induction of general anesthesia, antibiotic prophylaxis was administered. VTE prophylaxis was in place. Appropriate anesthesia was then administered and tolerated well. The chest was prepped with Chloraprep and draped in the sterile fashion. The patient was positioned in the supine position.   A visual dye  ( isosulfan blue) was injected periareolar early under aseptic conditions. Then using the hand-held probe an area of high counts was identified in the axilla, an incision was made and direction by the probe aided in dissection of a lymph node which was hot and blue, we found a total of three lymph node with high counts.  Attention was turned to the  Left breast site where an incision was made in an elliptical fashion. Superior and inferior flaps were created with cautery and margins of mastectomy dissection where sternum, clavicle, latissimus and inframammary folds. The specimen along with the fascia was removed and labeled.  15 blake drain was placed within  the cavity and secure w 3-0 nylon.  Once assuring that hemostasis was adequate and checked multiple times the wound was closed with interrupted 2-0 Vicryl followed by 4-0 subcuticular Monocryl sutures. Dermabond used to coat the skin.  Needle and laparotomy counts were correct. Patient was taken to the recovery room in stable condition      Caroleen Hamman, MD, FACS

## 2018-05-17 NOTE — Anesthesia Preprocedure Evaluation (Signed)
Anesthesia Evaluation  Patient identified by MRN, date of birth, ID band Patient awake    Reviewed: Allergy & Precautions, NPO status , Patient's Chart, lab work & pertinent test results  History of Anesthesia Complications Negative for: history of anesthetic complications  Airway Mallampati: II  TM Distance: >3 FB Neck ROM: Full    Dental  (+) Upper Dentures, Edentulous Lower   Pulmonary neg pulmonary ROS, neg sleep apnea, neg COPD,    breath sounds clear to auscultation- rhonchi (-) wheezing      Cardiovascular hypertension, Pt. on medications (-) CAD, (-) Past MI, (-) Cardiac Stents and (-) CABG  Rhythm:Regular Rate:Normal - Systolic murmurs and - Diastolic murmurs    Neuro/Psych neg Seizures PSYCHIATRIC DISORDERS Depression negative neurological ROS     GI/Hepatic negative GI ROS, Neg liver ROS,   Endo/Other  negative endocrine ROSneg diabetes  Renal/GU negative Renal ROS     Musculoskeletal  (+) Arthritis ,   Abdominal (+) - obese,   Peds  Hematology negative hematology ROS (+)   Anesthesia Other Findings Past Medical History: 04/13/2018: Cancer ([place])     Comment:  left breast cancer No date: Depression No date: Hyperlipidemia No date: Hypertension   Reproductive/Obstetrics                             Anesthesia Physical Anesthesia Plan  ASA: II  Anesthesia Plan: General   Post-op Pain Management:    Induction: Intravenous  PONV Risk Score and Plan: 2 and Ondansetron and Dexamethasone  Airway Management Planned: LMA  Additional Equipment:   Intra-op Plan:   Post-operative Plan:   Informed Consent: I have reviewed the patients History and Physical, chart, labs and discussed the procedure including the risks, benefits and alternatives for the proposed anesthesia with the patient or authorized representative who has indicated his/her understanding and acceptance.      Dental advisory given  Plan Discussed with: CRNA and Anesthesiologist  Anesthesia Plan Comments:         Anesthesia Quick Evaluation

## 2018-05-17 NOTE — Progress Notes (Signed)
Patient arrived from pacu.

## 2018-05-17 NOTE — Anesthesia Post-op Follow-up Note (Signed)
Anesthesia QCDR form completed.        

## 2018-05-17 NOTE — Interval H&P Note (Signed)
History and Physical Interval Note:  05/17/2018 9:17 AM  Christina Mccarthy  has presented today for surgery, with the diagnosis of BREAST CANCER  The various methods of treatment have been discussed with the patient and family. After consideration of risks, benefits and other options for treatment, the patient has consented to  Procedure(s): MASTECTOMY WITH SENTINEL LYMPH NODE BIOPSY (Left) as a surgical intervention .  The patient's history has been reviewed, patient examined, no change in status, stable for surgery.  I have reviewed the patient's chart and labs.  Questions were answered to the patient's satisfaction.     [place]

## 2018-05-17 NOTE — Transfer of Care (Signed)
Immediate Anesthesia Transfer of Care Note  Patient: Christina Mccarthy  Procedure(s) Performed: MASTECTOMY WITH SENTINEL LYMPH NODE BIOPSY (Left )  Patient Location: PACU  Anesthesia Type:General  Level of Consciousness: awake  Airway & Oxygen Therapy: Patient Spontanous Breathing  Post-op Assessment: Report given to RN  Post vital signs: stable  Last Vitals:  Vitals Value Taken Time  BP 140/60 05/17/2018 12:10 PM  Temp 36.4 C 05/17/2018 12:10 PM  Pulse 85 05/17/2018 12:16 PM  Resp 13 05/17/2018 12:16 PM  SpO2 100 % 05/17/2018 12:16 PM  Vitals shown include unvalidated device data.  Last Pain:  Vitals:   05/17/18 0829  TempSrc: Temporal  PainSc: 0-No pain         Complications: No apparent anesthesia complications

## 2018-05-17 NOTE — Progress Notes (Signed)
BP running low.  Dr Dahlia Byes notified.  No new orders given at this time

## 2018-05-17 NOTE — Anesthesia Procedure Notes (Signed)
Procedure Name: LMA Insertion Date/Time: 05/17/2018 9:55 AM Performed by: Leander Rams, CRNA Pre-anesthesia Checklist: Patient identified, Emergency Drugs available, Suction available, Patient being monitored and Timeout performed Patient Re-evaluated:Patient Re-evaluated prior to induction Oxygen Delivery Method: Circle system utilized Preoxygenation: Pre-oxygenation with 100% oxygen Induction Type: IV induction LMA: LMA inserted LMA Size: 3.0 Number of attempts: 1

## 2018-05-18 ENCOUNTER — Encounter: Payer: Self-pay | Admitting: Surgery

## 2018-05-18 DIAGNOSIS — Z9841 Cataract extraction status, right eye: Secondary | ICD-10-CM | POA: Diagnosis not present

## 2018-05-18 DIAGNOSIS — E785 Hyperlipidemia, unspecified: Secondary | ICD-10-CM | POA: Diagnosis not present

## 2018-05-18 DIAGNOSIS — Z90710 Acquired absence of both cervix and uterus: Secondary | ICD-10-CM | POA: Diagnosis not present

## 2018-05-18 DIAGNOSIS — F329 Major depressive disorder, single episode, unspecified: Secondary | ICD-10-CM | POA: Diagnosis not present

## 2018-05-18 DIAGNOSIS — C50912 Malignant neoplasm of unspecified site of left female breast: Secondary | ICD-10-CM | POA: Diagnosis not present

## 2018-05-18 DIAGNOSIS — Z79899 Other long term (current) drug therapy: Secondary | ICD-10-CM | POA: Diagnosis not present

## 2018-05-18 DIAGNOSIS — Z9842 Cataract extraction status, left eye: Secondary | ICD-10-CM | POA: Diagnosis not present

## 2018-05-18 DIAGNOSIS — I10 Essential (primary) hypertension: Secondary | ICD-10-CM | POA: Diagnosis not present

## 2018-05-18 DIAGNOSIS — Z791 Long term (current) use of non-steroidal anti-inflammatories (NSAID): Secondary | ICD-10-CM | POA: Diagnosis not present

## 2018-05-18 MED ORDER — OXYCODONE HCL 5 MG PO TABS: 5.0000 mg | ORAL_TABLET | Freq: Four times a day (QID) | ORAL | 0 refills | Status: DC | PRN

## 2018-05-18 NOTE — Discharge Summary (Signed)
[facility] SURGICAL DISCHARGE SUMMARY   Patient ID: Christina Mccarthy MRN: [id number] DOB/AGE: 12/24/1929 85 y.o.  Admit date: 05/17/2018 Discharge date: 05/18/2018  Discharge Diagnoses Breast CA (Left)  Consultants None  Procedures -- 05/17/2018:  Left simple mastectomy and sentinel node biopsy  HPI: Christina Mccarthy is a 85 y.o. female with a known history of left breast cancer who presents to [facility] on 05/17/2018 for scheduled left simple mastectomy and sentinel node biopsy with Dr Dahlia Byes.   Hospital Course: Informed consent was obtained and documented, and patient underwent uneventful left simple mastectomy and sentinel node biopsy (Dr Dahlia Byes, MD, 05/17/2018).  Post-operatively, patient's pain improved/resolved and advancement of patient's diet and ambulation were well-tolerated. The remainder of patient's hospital course was essentially unremarkable, and discharge planning was initiated accordingly with patient safely able to be discharged home with appropriate discharge instructions, pain control, and outpatient follow-up after all of her and her family's questions were answered to their expressed satisfaction.  Discharge Condition: Good  Physical Examination:  Constitutional: Well-appearing female, NAD Pulmonary: Normal effort, no respiratory distress Chest: left mastectomy incision is CDI, no erythema or drainage, mild surrounding ecchymosis. Chest is flat. JP drain in left lateral chest (serosanguinous)   Allergies as of 05/18/2018   No Known Allergies     Medication List    TAKE these medications   FISH OIL PEARLS PO Take 1,000 mg by mouth daily.   HAIR/SKIN/NAILS PO Take 1 tablet by mouth daily.   ICAPS AREDS 2 PO Take 1 capsule by mouth 2 (two) times daily.   losartan 25 MG tablet Commonly known as:  COZAAR TAKE 1 TABLET DAILY.   meloxicam 7.5 MG tablet Commonly known as:  MOBIC TAKE 1 TABLET DAILY.   oxyCODONE 5 MG immediate release tablet Commonly  known as:  Oxy IR/ROXICODONE Take 1 tablet (5 mg total) by mouth every 6 (six) hours as needed for severe pain or breakthrough pain.   pravastatin 40 MG tablet Commonly known as:  PRAVACHOL TAKE 1 TABLET DAILY.   sertraline 25 MG tablet Commonly known as:  ZOLOFT TAKE 1 TABLET DAILY.   sertraline 50 MG tablet Commonly known as:  ZOLOFT TAKE 1 TABLET DAILY.        Follow-up Information    Pabon, [place] F, MD. Schedule an appointment as soon as possible for a visit in 1 week(s).   Specialty:  General Surgery Why:  s/p left mastectomy Contact information: [address] [place] [address] [phone]            --   , PA-C [facility]  05/18/2018, 9:10 AM [phone] M-F: 7am - 4pm

## 2018-05-18 NOTE — Care Management Obs Status (Signed)
[place] NOTIFICATION   Patient Details  Name: Era Parr MRN: [id number] Date of Birth: January 09, 1930   Medicare Observation Status Notification Given:  No(admitted obs less thand 24 hours)    Beverly Sessions, RN 05/18/2018, 1:53 PM

## 2018-05-18 NOTE — Progress Notes (Signed)
Pt discharged per MD order. IV removed. Discharge instructions reviewed with pt and daughter. Pt and daughter were able to verbally walk through steps of emptying the JP drain. Pt taken to car in wheelchair by staff.

## 2018-05-18 NOTE — Anesthesia Postprocedure Evaluation (Signed)
Anesthesia Post Note  Patient: Christina Mccarthy  Procedure(s) Performed: MASTECTOMY WITH SENTINEL LYMPH NODE BIOPSY (Left )  Patient location during evaluation: PACU Anesthesia Type: General Level of consciousness: awake and alert and oriented Pain management: pain level controlled Vital Signs Assessment: post-procedure vital signs reviewed and stable Respiratory status: spontaneous breathing, nonlabored ventilation and respiratory function stable Cardiovascular status: blood pressure returned to baseline and stable Postop Assessment: no signs of nausea or vomiting Anesthetic complications: no     Last Vitals:  Vitals:   05/17/18 2351 05/18/18 0511  BP: (!) 102/53 (!) 104/49  Pulse: 80 77  Resp: 18 20  Temp: 37.2 C 36.9 C  SpO2: 98% 96%    Last Pain:  Vitals:   05/18/18 0511  TempSrc: Oral  PainSc:                  Shauni Henner

## 2018-05-21 ENCOUNTER — Telehealth: Payer: Self-pay

## 2018-05-21 LAB — SURGICAL PATHOLOGY

## 2018-05-21 NOTE — Telephone Encounter (Signed)
Attempted to reach patient for TCM call. Left msg for pt to contact the office to complete call.

## 2018-05-21 NOTE — Telephone Encounter (Signed)
D/w pt' daughter POA. About path results. She is very Patent attorney. She is otherwise doing very well . No concerns at this time

## 2018-05-27 NOTE — Progress Notes (Signed)
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    INTERVAL HISTORY: Patient returns to clinic today for further evaluation and postoperative follow-up.  She recently underwent mastectomy and tolerated the procedure well without significant side effects.  She continues to tolerate letrozole.  She currently feels well and is asymptomatic. She has no neurologic complaints.  She denies any recent fevers or illnesses.  She has a good appetite and denies weight loss. She has no chest pain or shortness of breath.  She denies any nausea, vomiting, constipation, or diarrhea.  She has no urinary complaints.  Patient offers no specific complaints today.  REVIEW OF SYSTEMS:   Review of Systems  Constitutional: Negative.  Negative for fever, malaise/fatigue and weight loss.  Respiratory: Negative.  Negative for cough and shortness of breath.   Cardiovascular: Negative.  Negative for chest pain and leg swelling.  Gastrointestinal: Negative.  Negative for abdominal pain.  Genitourinary: Negative.  Negative for dysuria.  Musculoskeletal: Negative.  Negative for back pain.  Skin: Negative.  Negative for rash.  Neurological: Negative.  Negative for sensory change, focal weakness and weakness.  Psychiatric/Behavioral: Negative.  The patient is not nervous/anxious.     As per HPI. Otherwise, a complete review of systems is negative.  PAST MEDICAL HISTORY: Past Medical History:  Diagnosis Date  . Cancer ([place]) 04/13/2018   left breast cancer  . Depression   . Hyperlipidemia   . Hypertension     PAST SURGICAL HISTORY: Past Surgical History:    Procedure Laterality Date  . APPENDECTOMY    . BACK SURGERY    . BREAST BIOPSY Left 04/2018  . CATARACT EXTRACTION, BILATERAL    . CHOLECYSTECTOMY    . EYE SURGERY    . HYSTERECTOMY ABDOMINAL WITH SALPINGO-OOPHORECTOMY     benign reason  . INNER EAR SURGERY Bilateral   . LUMBAR DISC SURGERY    . MASTECTOMY W/ SENTINEL NODE BIOPSY Left 05/17/2018   Procedure: MASTECTOMY WITH SENTINEL LYMPH NODE BIOPSY;  Surgeon: Jules Husbands, MD;  Location: [facility] ORS;  Service: General;  Laterality: Left;    FAMILY HISTORY: Family History  Problem Relation Age of Onset  . Breast cancer Mother 62  . CAD Father     ADVANCED DIRECTIVES (Y/N):  N  HEALTH MAINTENANCE: Social History   Tobacco Use  . Smoking status: Never Smoker  . Smokeless tobacco: Never Used  Substance Use Topics  . Alcohol use: Never    Frequency: Never  . Drug use: Never     Colonoscopy:  PAP:  Bone density:  Lipid panel:  No Known Allergies  Current Outpatient Medications  Medication Sig Dispense Refill  . Biotin w/ Vitamins C & E (HAIR/SKIN/NAILS PO) Take 1 tablet by mouth daily.    Marland Kitchen losartan (COZAAR) 25 MG tablet TAKE 1 TABLET DAILY. (Patient taking differently: Take 25 mg by mouth daily. ) 90 tablet 1  . meloxicam (MOBIC) 7.5 MG tablet TAKE 1 TABLET DAILY. (Patient taking differently: Take 7.5 mg by mouth daily. ) 90 tablet 1  . Multiple Vitamins-Minerals (ICAPS AREDS 2 PO) Take 1 capsule by mouth 2 (two) times daily.     . Omega-3 Fatty Acids (  FISH OIL PEARLS PO) Take 1,000 mg by mouth daily.     Marland Kitchen oxyCODONE (OXY IR/ROXICODONE) 5 MG immediate release tablet Take 1 tablet (5 mg total) by mouth every 6 (six) hours as needed for severe pain or breakthrough pain. 20 tablet 0  . pravastatin (PRAVACHOL) 40 MG tablet TAKE 1 TABLET DAILY. (Patient taking differently: Take 40 mg by mouth daily. ) 90 tablet 1  . sertraline (ZOLOFT) 25 MG tablet TAKE 1 TABLET DAILY. (Patient taking differently: Take 25 mg by mouth  daily. ) 90 tablet 1  . sertraline (ZOLOFT) 50 MG tablet TAKE 1 TABLET DAILY. (Patient taking differently: Take 50 mg by mouth daily. ) 90 tablet 1  . letrozole (FEMARA) 2.5 MG tablet Take 1 tablet (2.5 mg total) by mouth daily. 90 tablet 3   No current facility-administered medications for this visit.     OBJECTIVE: There were no vitals filed for this visit.   There is no height or weight on file to calculate BMI.    ECOG FS:0 - Asymptomatic  General: Well-developed, well-nourished, no acute distress. Eyes: Pink conjunctiva, anicteric sclera. HEENT: Normocephalic, moist mucous membranes. Breast: Left mastectomy. Lungs: Clear to auscultation bilaterally. Heart: Regular rate and rhythm. No rubs, murmurs, or gallops. Abdomen: Soft, nontender, nondistended. No organomegaly noted, normoactive bowel sounds. Musculoskeletal: No edema, cyanosis, or clubbing. Neuro: Alert, answering all questions appropriately. Cranial nerves grossly intact. Skin: No rashes or petechiae noted. Psych: Normal affect.  LAB RESULTS:  Lab Results  Component Value Date   NA 137 03/09/2018   K 5.0 03/09/2018   CL 98 03/09/2018   CO2 25 03/09/2018   GLUCOSE 76 03/09/2018   BUN 15 03/09/2018   CREATININE 0.80 05/17/2018   CALCIUM 9.7 03/09/2018   PROT 6.0 03/09/2018   ALBUMIN 4.4 03/09/2018   AST 22 03/09/2018   ALT 11 03/09/2018   ALKPHOS 80 03/09/2018   BILITOT 0.4 03/09/2018   GFRNONAA >60 05/17/2018   GFRAA >60 05/17/2018    Lab Results  Component Value Date   WBC 13.2 (H) 05/17/2018   NEUTROABS 6.5 03/09/2018   HGB 14.2 05/17/2018   HCT 43.5 05/17/2018   MCV 89.9 05/17/2018   PLT 168 05/17/2018     STUDIES: Nm Sentinel Node Injection  Result Date: 05/17/2018 CLINICAL DATA:  Left breast cancer. EXAM: NUCLEAR MEDICINE BREAST LYMPHOSCINTIGRAPHY TECHNIQUE: Intradermal injection of radiopharmaceutical was performed at the 12 o'clock, 3 o'clock, 6 o'clock, and 9 o'clock positions around the  left nipple. The patient was then sent to the operating room where the sentinel node(s) were identified and removed by the surgeon. RADIOPHARMACEUTICALS:  Total of 1 mCi Millipore-filtered Technetium-84msulfur colloid, injected in four aliquots of 0.25 mCi each. IMPRESSION: Uncomplicated intradermal injection of a total of 1 mCi Technetium-974mulfur colloid for purposes of sentinel node identification. Electronically Signed   By: GlAletta Edouard.D.   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  Because of this, she does not require adjuvant XRT.  Will send Oncotype DX score for completeness, but given her advanced age will only use this for prognostic indications for consideration of extended aromatase inhibitor use.  Patient was given a prescription for letrozole today which she will take for  a minimum of 5 years completing in January 2025.  She will also require a baseline bone mineral density.  Return to clinic in 3 months for further evaluation.    I spent a total of 30 minutes face-to-face with the patient of which greater than 50% of the visit was spent in counseling and coordination of care as detailed above.  Patient expressed understanding and was in agreement with this plan. She also understands that She can call clinic at any time with any questions, concerns, or complaints.   Cancer Staging Breast cancer, left breast [facility]) Staging form: Breast, AJCC 8th Edition - Clinical stage from 04/22/2018: Stage IB (cT2, cN0, cM0, G2, ER+, PR+, HER2-) - Signed by Lloyd Huger, MD on 04/24/2018   Lloyd Huger, MD   05/30/2018 6:31 AM

## 2018-05-28 ENCOUNTER — Other Ambulatory Visit: Payer: Self-pay

## 2018-05-28 ENCOUNTER — Encounter: Payer: Self-pay | Admitting: Surgery

## 2018-05-28 ENCOUNTER — Ambulatory Visit (INDEPENDENT_AMBULATORY_CARE_PROVIDER_SITE_OTHER): Payer: Medicare PPO | Admitting: Surgery

## 2018-05-28 VITALS — BP 149/83 | HR 74 | Temp 97.5°F | Ht <= 58 in | Wt 112.2 lb

## 2018-05-28 DIAGNOSIS — Z09 Encounter for follow-up examination after completed treatment for conditions other than malignant neoplasm: Secondary | ICD-10-CM

## 2018-05-28 NOTE — Progress Notes (Signed)
S/p L Mastectomy SLNBx Path d/w the pt in detail less than 15 cc day from drain Mild pain No fevers or chills  PE NAD Incision w middle of superior flap w ecchymosis. NO infection , seroma or hematoma. Drain removed  A/p Ecchymosis of sup flap, I will monitor and make sure the sup skin does not necrose. No need for debridement at this time F/u 1 week

## 2018-05-28 NOTE — Patient Instructions (Signed)
Patient will need to return in 1 week with Dr.Pabon.  Call the office with any questions or concerns.

## 2018-05-29 ENCOUNTER — Encounter: Payer: Self-pay | Admitting: Oncology

## 2018-05-29 ENCOUNTER — Other Ambulatory Visit: Payer: Self-pay | Admitting: Internal Medicine

## 2018-05-29 ENCOUNTER — Inpatient Hospital Stay: Payer: Medicare PPO | Attending: Oncology | Admitting: Oncology

## 2018-05-29 ENCOUNTER — Encounter: Payer: Self-pay | Admitting: *Deleted

## 2018-05-29 ENCOUNTER — Other Ambulatory Visit: Payer: Self-pay

## 2018-05-29 DIAGNOSIS — I10 Essential (primary) hypertension: Secondary | ICD-10-CM

## 2018-05-29 DIAGNOSIS — Z79899 Other long term (current) drug therapy: Secondary | ICD-10-CM

## 2018-05-29 DIAGNOSIS — E782 Mixed hyperlipidemia: Secondary | ICD-10-CM

## 2018-05-29 DIAGNOSIS — Z170 Estrogen receptor positive status [ER+]: Secondary | ICD-10-CM | POA: Diagnosis not present

## 2018-05-29 DIAGNOSIS — C50412 Malignant neoplasm of upper-outer quadrant of left female breast: Secondary | ICD-10-CM

## 2018-05-29 DIAGNOSIS — F324 Major depressive disorder, single episode, in partial remission: Secondary | ICD-10-CM

## 2018-05-29 DIAGNOSIS — M1710 Unilateral primary osteoarthritis, unspecified knee: Secondary | ICD-10-CM

## 2018-05-29 DIAGNOSIS — Z90710 Acquired absence of both cervix and uterus: Secondary | ICD-10-CM

## 2018-05-29 MED ORDER — LETROZOLE 2.5 MG PO TABS: 2.5000 mg | ORAL_TABLET | Freq: Every day | ORAL | 3 refills | Status: DC

## 2018-05-29 NOTE — Progress Notes (Signed)
Patient here today for follow up regarding breast cancer. Patient seen by Dr. Dahlia Byes yesterday for surgery follow up, drain removed. Patient reports pain 2/10 this morning.

## 2018-05-29 NOTE — Progress Notes (Signed)
  Oncology Nurse Navigator Documentation  Navigator Location: [facility]Med Onc (05/29/18 1000)   )Navigator Encounter Type: Clinic/MDC (05/29/18 1000)                     Patient Visit Type: MedOnc (05/29/18 1000)                              Time Spent with Patient: 15 (05/29/18 1000)   Met patient during her follow up visit with Dr. Grayland Ormond.  She is doing well post up.  No needs at this time.  She is going to be treated with antihormonal therapy only.

## 2018-05-30 ENCOUNTER — Other Ambulatory Visit: Payer: Self-pay | Admitting: Internal Medicine

## 2018-05-30 DIAGNOSIS — I10 Essential (primary) hypertension: Secondary | ICD-10-CM

## 2018-05-30 MED ORDER — LOSARTAN POTASSIUM 25 MG PO TABS: 25.0000 mg | ORAL_TABLET | Freq: Every day | ORAL | 1 refills | Status: DC

## 2018-06-05 ENCOUNTER — Ambulatory Visit
Admission: RE | Admit: 2018-06-05 | Discharge: 2018-06-05 | Disposition: A | Discharge status: Home / Self Care | Payer: Medicare PPO | Source: Ambulatory Visit | Attending: Oncology | Admitting: Oncology

## 2018-06-05 DIAGNOSIS — M810 Age-related osteoporosis without current pathological fracture: Secondary | ICD-10-CM | POA: Diagnosis not present

## 2018-06-05 DIAGNOSIS — C50412 Malignant neoplasm of upper-outer quadrant of left female breast: Secondary | ICD-10-CM | POA: Insufficient documentation

## 2018-06-05 DIAGNOSIS — Z780 Asymptomatic menopausal state: Secondary | ICD-10-CM | POA: Diagnosis not present

## 2018-06-05 DIAGNOSIS — Z170 Estrogen receptor positive status [ER+]: Secondary | ICD-10-CM | POA: Insufficient documentation

## 2018-06-06 ENCOUNTER — Other Ambulatory Visit: Payer: Self-pay

## 2018-06-06 ENCOUNTER — Other Ambulatory Visit: Payer: Self-pay | Admitting: Anatomic Pathology & Clinical Pathology

## 2018-06-06 ENCOUNTER — Other Ambulatory Visit: Payer: Self-pay | Admitting: *Deleted

## 2018-06-06 ENCOUNTER — Encounter: Payer: Self-pay | Admitting: Oncology

## 2018-06-06 ENCOUNTER — Encounter: Payer: Self-pay | Admitting: *Deleted

## 2018-06-06 ENCOUNTER — Encounter: Payer: Self-pay | Admitting: Surgery

## 2018-06-06 ENCOUNTER — Ambulatory Visit (INDEPENDENT_AMBULATORY_CARE_PROVIDER_SITE_OTHER): Payer: Medicare PPO | Admitting: Surgery

## 2018-06-06 VITALS — BP 145/60 | HR 82 | Temp 98.2°F | Resp 15 | Ht <= 58 in | Wt 108.0 lb

## 2018-06-06 DIAGNOSIS — Z170 Estrogen receptor positive status [ER+]: Principal | ICD-10-CM

## 2018-06-06 DIAGNOSIS — C50912 Malignant neoplasm of unspecified site of left female breast: Secondary | ICD-10-CM | POA: Diagnosis not present

## 2018-06-06 DIAGNOSIS — C50412 Malignant neoplasm of upper-outer quadrant of left female breast: Secondary | ICD-10-CM

## 2018-06-06 DIAGNOSIS — Z09 Encounter for follow-up examination after completed treatment for conditions other than malignant neoplasm: Secondary | ICD-10-CM

## 2018-06-06 MED ORDER — AMOXICILLIN-POT CLAVULANATE 875-125 MG PO TABS: 1.0000 | ORAL_TABLET | Freq: Two times a day (BID) | ORAL | 0 refills | Status: DC

## 2018-06-06 NOTE — Progress Notes (Signed)
Order placed for home health in [place].   Kim with [facility] contacted who states they can accept the referral. Maudie Mercury has sent the referral in to their intake team. She has tried to put it under one of Dr. Corlis Leak protocols but states they may require his signature on the referral in Epic to change the frequency of visits for daily dressing changes.

## 2018-06-06 NOTE — Patient Instructions (Addendum)
Continue to wear compression binder. Start your antibiotics. Continue dressing changes every day. Wet with saline before removing gauze. You may shower, pat area dry before placing new dressing.  Push in the 2 inch gauze into the wound and cover with 4x4 gauze and pad and tape in place.   Follow up on Monday.  We will arrange for home health to help with dressing changes.

## 2018-06-06 NOTE — Progress Notes (Signed)
S/p Left mastectomy 1/16  She feels well Minimal pain.no fevers or chills Now with some black areas on her superior flap  PE NAD Superior flap w necrotic area measuring 4x2 cm, at the center of the flap. Using sterile technique I debrided the skin. I also drain moderate seroma about 75 ccserous fluid. There was intact muscle deep to the wound. No evidence of necrotizing infection or purulence. Dry packing applied.   A/p Necrosis of central portion of superior flap Start daily dry dressings  Short course of A/bs to prevent infection F/U next Monday H/H for wound care

## 2018-06-07 ENCOUNTER — Telehealth: Payer: Self-pay

## 2018-06-07 ENCOUNTER — Other Ambulatory Visit: Payer: Self-pay | Admitting: Oncology

## 2018-06-07 MED ORDER — ALENDRONATE SODIUM 70 MG PO TABS: 70.0000 mg | ORAL_TABLET | ORAL | 4 refills | Status: DC

## 2018-06-07 NOTE — Telephone Encounter (Signed)
Called patient but spoke to daughter-Christina Mccarthy. I told her that her mother's bone density was back and per Dr. Grayland Ormond, he wanted her to know that the patient had osteoporosis. Therefore, he had sent her a prescription of Fosamax to her mail order pharmacy. I also gave her the instructions on how her mother is to take her medication. Christina Mccarthy understood and had no further questions.

## 2018-06-07 NOTE — Telephone Encounter (Signed)
-----   Message from Lloyd Huger, MD sent at 06/07/2018  6:15 AM EST ----- Juluis Rainier, Fosamax called in to her mail order pharmacy.  ----- Message ----- From: Interface, Rad Results In Sent: 06/05/2018   4:27 PM EST To: Lloyd Huger, MD

## 2018-06-08 DIAGNOSIS — C50912 Malignant neoplasm of unspecified site of left female breast: Secondary | ICD-10-CM | POA: Diagnosis not present

## 2018-06-08 DIAGNOSIS — T8131XD Disruption of external operation (surgical) wound, not elsewhere classified, subsequent encounter: Secondary | ICD-10-CM | POA: Diagnosis not present

## 2018-06-08 DIAGNOSIS — Z170 Estrogen receptor positive status [ER+]: Secondary | ICD-10-CM | POA: Diagnosis not present

## 2018-06-08 DIAGNOSIS — Z4801 Encounter for change or removal of surgical wound dressing: Secondary | ICD-10-CM | POA: Diagnosis not present

## 2018-06-08 DIAGNOSIS — F324 Major depressive disorder, single episode, in partial remission: Secondary | ICD-10-CM | POA: Diagnosis not present

## 2018-06-08 DIAGNOSIS — Z483 Aftercare following surgery for neoplasm: Secondary | ICD-10-CM | POA: Diagnosis not present

## 2018-06-08 DIAGNOSIS — M1710 Unilateral primary osteoarthritis, unspecified knee: Secondary | ICD-10-CM | POA: Diagnosis not present

## 2018-06-08 DIAGNOSIS — I10 Essential (primary) hypertension: Secondary | ICD-10-CM | POA: Diagnosis not present

## 2018-06-11 ENCOUNTER — Other Ambulatory Visit: Payer: Self-pay

## 2018-06-11 ENCOUNTER — Ambulatory Visit (INDEPENDENT_AMBULATORY_CARE_PROVIDER_SITE_OTHER): Payer: Medicare PPO | Admitting: Surgery

## 2018-06-11 ENCOUNTER — Encounter: Payer: Self-pay | Admitting: Surgery

## 2018-06-11 VITALS — BP 129/72 | HR 98 | Temp 97.9°F | Resp 14 | Ht <= 58 in | Wt 108.0 lb

## 2018-06-11 DIAGNOSIS — Z09 Encounter for follow-up examination after completed treatment for conditions other than malignant neoplasm: Secondary | ICD-10-CM

## 2018-06-11 NOTE — Patient Instructions (Signed)
Follow up Wednesday next week. You may stop using the compression wrap and just use a bra or sports bra. Continue with dressing changes

## 2018-06-13 ENCOUNTER — Encounter: Payer: Self-pay | Admitting: Surgery

## 2018-06-13 DIAGNOSIS — M1710 Unilateral primary osteoarthritis, unspecified knee: Secondary | ICD-10-CM | POA: Diagnosis not present

## 2018-06-13 DIAGNOSIS — F324 Major depressive disorder, single episode, in partial remission: Secondary | ICD-10-CM | POA: Diagnosis not present

## 2018-06-13 DIAGNOSIS — T8131XD Disruption of external operation (surgical) wound, not elsewhere classified, subsequent encounter: Secondary | ICD-10-CM | POA: Diagnosis not present

## 2018-06-13 DIAGNOSIS — Z4801 Encounter for change or removal of surgical wound dressing: Secondary | ICD-10-CM | POA: Diagnosis not present

## 2018-06-13 DIAGNOSIS — C50912 Malignant neoplasm of unspecified site of left female breast: Secondary | ICD-10-CM | POA: Diagnosis not present

## 2018-06-13 DIAGNOSIS — Z483 Aftercare following surgery for neoplasm: Secondary | ICD-10-CM | POA: Diagnosis not present

## 2018-06-13 DIAGNOSIS — Z170 Estrogen receptor positive status [ER+]: Secondary | ICD-10-CM | POA: Diagnosis not present

## 2018-06-13 DIAGNOSIS — I10 Essential (primary) hypertension: Secondary | ICD-10-CM | POA: Diagnosis not present

## 2018-06-13 NOTE — Progress Notes (Signed)
S/p Left mastectomy 1/16  She feels well Minimal pain.no fevers or chills Necrosis of some of the superior flap, now doing wet/dry  PE NAD Superior flap w  Additional necrotic area measuring 2x2 cm, at the center of the flap. Using sterile technique I debrided the skin.rest of the owund has great granulation tissue. No infection No evidence of necrotizing infection or purulence. Wet/ Dry  applied.   A/p Necrosis of central portion of superior flap Doing well and healing well F/U next week, continue daily wound care

## 2018-06-14 DIAGNOSIS — T8189XA Other complications of procedures, not elsewhere classified, initial encounter: Secondary | ICD-10-CM | POA: Diagnosis not present

## 2018-06-15 ENCOUNTER — Telehealth: Payer: Self-pay | Admitting: *Deleted

## 2018-06-15 NOTE — Telephone Encounter (Signed)
Christina Mccarthy gave verbal order for home health to come once a week, patient agreed and was pleased.

## 2018-06-15 NOTE — Telephone Encounter (Signed)
Patients daughter called and stated that the patient was ordered home health and patient does not want them to come help take care of her, she is wanting her daughter.

## 2018-06-19 DIAGNOSIS — Z170 Estrogen receptor positive status [ER+]: Secondary | ICD-10-CM | POA: Diagnosis not present

## 2018-06-19 DIAGNOSIS — C50912 Malignant neoplasm of unspecified site of left female breast: Secondary | ICD-10-CM | POA: Diagnosis not present

## 2018-06-19 DIAGNOSIS — I10 Essential (primary) hypertension: Secondary | ICD-10-CM | POA: Diagnosis not present

## 2018-06-19 DIAGNOSIS — T8131XD Disruption of external operation (surgical) wound, not elsewhere classified, subsequent encounter: Secondary | ICD-10-CM | POA: Diagnosis not present

## 2018-06-19 DIAGNOSIS — M1710 Unilateral primary osteoarthritis, unspecified knee: Secondary | ICD-10-CM | POA: Diagnosis not present

## 2018-06-19 DIAGNOSIS — Z4801 Encounter for change or removal of surgical wound dressing: Secondary | ICD-10-CM | POA: Diagnosis not present

## 2018-06-19 DIAGNOSIS — Z483 Aftercare following surgery for neoplasm: Secondary | ICD-10-CM | POA: Diagnosis not present

## 2018-06-19 DIAGNOSIS — F324 Major depressive disorder, single episode, in partial remission: Secondary | ICD-10-CM | POA: Diagnosis not present

## 2018-06-20 ENCOUNTER — Ambulatory Visit (INDEPENDENT_AMBULATORY_CARE_PROVIDER_SITE_OTHER): Payer: Medicare PPO | Admitting: Surgery

## 2018-06-20 ENCOUNTER — Encounter: Payer: Self-pay | Admitting: Surgery

## 2018-06-20 ENCOUNTER — Other Ambulatory Visit: Payer: Self-pay

## 2018-06-20 VITALS — BP 135/77 | HR 72 | Temp 96.8°F | Resp 16 | Ht <= 58 in | Wt 102.0 lb

## 2018-06-20 DIAGNOSIS — Z09 Encounter for follow-up examination after completed treatment for conditions other than malignant neoplasm: Secondary | ICD-10-CM

## 2018-06-20 NOTE — Patient Instructions (Signed)
Patient will need to return to the office in 10 days with D.Pabon.   Call the office with any questions or concerns.

## 2018-06-20 NOTE — Addendum Note (Signed)
Addended by: Dominga Ferry on: 06/20/2018 02:22 PM   Modules accepted: Orders

## 2018-06-22 ENCOUNTER — Encounter: Payer: Self-pay | Admitting: Surgery

## 2018-06-22 NOTE — Progress Notes (Signed)
S/p mastectomy. Here for wound check Healing well, doing daily wet/dry No Fevers or chills No N/V   PE NAD  Wound healing well, good granulation tissue, area measures 7x3.5 cms. No infection. No lymphedema or undrained pockets  A/p Doing well F/u 2 weeks for another wound check No need for further intervention Continue wound care

## 2018-06-28 DIAGNOSIS — T8131XD Disruption of external operation (surgical) wound, not elsewhere classified, subsequent encounter: Secondary | ICD-10-CM | POA: Diagnosis not present

## 2018-06-28 DIAGNOSIS — C50912 Malignant neoplasm of unspecified site of left female breast: Secondary | ICD-10-CM | POA: Diagnosis not present

## 2018-06-28 DIAGNOSIS — Z4801 Encounter for change or removal of surgical wound dressing: Secondary | ICD-10-CM | POA: Diagnosis not present

## 2018-06-28 DIAGNOSIS — I10 Essential (primary) hypertension: Secondary | ICD-10-CM | POA: Diagnosis not present

## 2018-06-28 DIAGNOSIS — Z483 Aftercare following surgery for neoplasm: Secondary | ICD-10-CM | POA: Diagnosis not present

## 2018-06-28 DIAGNOSIS — F324 Major depressive disorder, single episode, in partial remission: Secondary | ICD-10-CM | POA: Diagnosis not present

## 2018-06-28 DIAGNOSIS — M1710 Unilateral primary osteoarthritis, unspecified knee: Secondary | ICD-10-CM | POA: Diagnosis not present

## 2018-06-28 DIAGNOSIS — Z170 Estrogen receptor positive status [ER+]: Secondary | ICD-10-CM | POA: Diagnosis not present

## 2018-06-29 DIAGNOSIS — M170 Bilateral primary osteoarthritis of knee: Secondary | ICD-10-CM | POA: Diagnosis not present

## 2018-07-04 ENCOUNTER — Encounter: Payer: Self-pay | Admitting: Surgery

## 2018-07-04 ENCOUNTER — Ambulatory Visit (INDEPENDENT_AMBULATORY_CARE_PROVIDER_SITE_OTHER): Payer: Medicare PPO | Admitting: Surgery

## 2018-07-04 ENCOUNTER — Other Ambulatory Visit: Payer: Self-pay

## 2018-07-04 VITALS — BP 146/83 | HR 70 | Temp 97.7°F | Ht <= 58 in | Wt 106.0 lb

## 2018-07-04 DIAGNOSIS — Z09 Encounter for follow-up examination after completed treatment for conditions other than malignant neoplasm: Secondary | ICD-10-CM

## 2018-07-04 NOTE — Patient Instructions (Addendum)
Patient will need to return to the office in 2 to 3 weeks.  Call the office with any questions or concerns.

## 2018-07-06 ENCOUNTER — Encounter: Payer: Self-pay | Admitting: Surgery

## 2018-07-06 NOTE — Progress Notes (Signed)
S/p L mastectomy doing well Here for wound check  PE: NAD Wound healing well, good granulation, no infection  A/p Doing well RTC 2-3 weeks

## 2018-07-16 ENCOUNTER — Telehealth: Payer: Self-pay | Admitting: Internal Medicine

## 2018-07-16 NOTE — Telephone Encounter (Signed)
Called to schedule Medicare Annual Wellness Visit with the Nurse Health Advisor.  No answer.  Left message on answering machine.  If patient returns call, please schedule AWV-I  with [facility] any date available.    Thank you! For any questions please contact: Janace Hoard at [phone] or Skype [email or web address]

## 2018-07-18 ENCOUNTER — Encounter: Payer: Medicare PPO | Admitting: Surgery

## 2018-08-06 ENCOUNTER — Encounter: Payer: Self-pay | Admitting: *Deleted

## 2018-08-06 NOTE — Progress Notes (Signed)
Left message for patient to call if she has any questions or needs.Marland Kitchen

## 2018-08-30 ENCOUNTER — Inpatient Hospital Stay: Payer: Medicare PPO | Admitting: Oncology

## 2018-09-07 ENCOUNTER — Ambulatory Visit: Payer: Medicare PPO | Admitting: Internal Medicine

## 2018-09-20 ENCOUNTER — Encounter: Payer: Self-pay | Admitting: Oncology

## 2018-09-27 ENCOUNTER — Other Ambulatory Visit: Payer: Self-pay | Admitting: Oncology

## 2018-09-27 DIAGNOSIS — M810 Age-related osteoporosis without current pathological fracture: Secondary | ICD-10-CM

## 2018-10-08 ENCOUNTER — Ambulatory Visit: Payer: Medicare PPO | Admitting: Internal Medicine

## 2018-10-15 ENCOUNTER — Encounter: Payer: Self-pay | Admitting: Oncology

## 2018-10-16 ENCOUNTER — Telehealth: Payer: Self-pay | Admitting: Licensed Clinical Social Worker

## 2018-10-16 NOTE — Telephone Encounter (Signed)
Called about upcoming genetics appointment. Spoke with patient's daughter, Arrie Aran, and they agreed to [employer] appointment.

## 2018-10-20 NOTE — Progress Notes (Signed)
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    INTERVAL HISTORY: Patient returns to clinic today for further evaluation and consideration of Prolia.  She currently feels well and is asymptomatic.  She is tolerating letrozole without significant side effects. She has no neurologic complaints.  She denies any recent fevers or illnesses.  She has a good appetite and denies weight loss.  She denies any chest pain, shortness of breath, cough, or hemoptysis.  She denies any nausea, vomiting, constipation, or diarrhea.  She has no urinary complaints.  Patient feels at her baseline offers no specific complaints today.  REVIEW OF SYSTEMS:   Review of Systems  Constitutional: Negative.  Negative for fever, malaise/fatigue and weight loss.  Respiratory: Negative.  Negative for cough and shortness of breath.   Cardiovascular: Negative.  Negative for chest pain and leg swelling.  Gastrointestinal: Negative.  Negative for abdominal pain.  Genitourinary: Negative.  Negative for dysuria.  Musculoskeletal: Negative.  Negative for back pain.  Skin: Negative.  Negative for rash.  Neurological: Negative.  Negative for sensory change, focal weakness and weakness.  Psychiatric/Behavioral: Negative.  The patient is not nervous/anxious.     As per HPI. Otherwise, a complete review of systems is negative.  PAST MEDICAL HISTORY: Past Medical History:  Diagnosis Date  . Cancer ([place]) 04/13/2018   left breast cancer  . Depression   . Family history of breast cancer   . Hyperlipidemia   . Hypertension     PAST SURGICAL HISTORY: Past Surgical  History:  Procedure Laterality Date  . APPENDECTOMY    . BACK SURGERY    . BREAST BIOPSY Left 04/2018  . CATARACT EXTRACTION, BILATERAL    . CHOLECYSTECTOMY    . EYE SURGERY    . HYSTERECTOMY ABDOMINAL WITH SALPINGO-OOPHORECTOMY     benign reason  . INNER EAR SURGERY Bilateral   . LUMBAR DISC SURGERY    . MASTECTOMY W/ SENTINEL NODE BIOPSY Left 05/17/2018   Procedure: MASTECTOMY WITH SENTINEL LYMPH NODE BIOPSY;  Surgeon: Jules Husbands, MD;  Location: [facility] ORS;  Service: General;  Laterality: Left;    FAMILY HISTORY: Family History  Problem Relation Age of Onset  . Breast cancer Mother 30  . CAD Father   . Breast cancer Maternal Aunt        dx 54s    ADVANCED DIRECTIVES (Y/N):  N  HEALTH MAINTENANCE: Social History   Tobacco Use  . Smoking status: Never Smoker  . Smokeless tobacco: Never Used  Substance Use Topics  . Alcohol use: Never    Frequency: Never  . Drug use: Never     Colonoscopy:  PAP:  Bone density:  Lipid panel:  No Known Allergies  Current Outpatient Medications  Medication Sig Dispense Refill  . Biotin w/ Vitamins C & E (HAIR/SKIN/NAILS PO) Take 1 tablet by mouth daily.    Marland Kitchen letrozole (FEMARA) 2.5 MG tablet Take 1 tablet (2.5 mg total) by mouth daily. 90 tablet 3  . losartan (COZAAR) 25 MG tablet Take 1 tablet (25 mg total) by mouth daily. 90 tablet 1  . meloxicam (MOBIC) 7.5 MG tablet Take 1 tablet (7.5 mg total) by mouth daily.  90 tablet 1  . Multiple Vitamins-Minerals (ICAPS AREDS 2 PO) Take 1 capsule by mouth 2 (two) times daily.     . Omega-3 Fatty Acids (FISH OIL PEARLS PO) Take 1,000 mg by mouth daily.     . pravastatin (PRAVACHOL) 40 MG tablet Take 1 tablet (40 mg total) by mouth daily. 90 tablet 1  . sertraline (ZOLOFT) 25 MG tablet Take 1 tablet (25 mg total) by mouth daily. 90 tablet 1  . sertraline (ZOLOFT) 50 MG tablet Take 1 tablet (50 mg total) by mouth daily. 90 tablet 1   No current facility-administered medications for this  visit.     OBJECTIVE: Vitals:   10/24/18 1031  BP: 140/76  Pulse: 69  Temp: (!) 96.7 F (35.9 C)     Body mass index is 20.69 kg/m.    ECOG FS:0 - Asymptomatic  General: Well-developed, well-nourished, no acute distress. Eyes: Pink conjunctiva, anicteric sclera. HEENT: Normocephalic, moist mucous membranes. Breast: Left mastectomy. Lungs: Clear to auscultation bilaterally. Heart: Regular rate and rhythm. No rubs, murmurs, or gallops. Abdomen: Soft, nontender, nondistended. No organomegaly noted, normoactive bowel sounds. Musculoskeletal: No edema, cyanosis, or clubbing. Neuro: Alert, answering all questions appropriately. Cranial nerves grossly intact. Skin: No rashes or petechiae noted. Psych: Normal affect.  LAB RESULTS:  Lab Results  Component Value Date   NA 139 10/24/2018   K 4.1 10/24/2018   CL 104 10/24/2018   CO2 26 10/24/2018   GLUCOSE 93 10/24/2018   BUN 19 10/24/2018   CREATININE 0.94 10/24/2018   CALCIUM 9.4 10/24/2018   PROT 6.0 (L) 10/24/2018   ALBUMIN 4.1 10/24/2018   AST 19 10/24/2018   ALT 12 10/24/2018   ALKPHOS 63 10/24/2018   BILITOT 0.5 10/24/2018   GFRNONAA 55 (L) 10/24/2018   GFRAA >60 10/24/2018    Lab Results  Component Value Date   WBC 7.8 10/24/2018   NEUTROABS 5.7 10/24/2018   HGB 12.2 10/24/2018   HCT 39.0 10/24/2018   MCV 91.3 10/24/2018   PLT 262 10/24/2018     STUDIES: No results found.  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  Because of this, she did not require adjuvant XRT.  Oncotype DX was 13 and therefore low risk therefore patient did not require adjuvant chemotherapy.  Continue letrozole for a total of 5 years completing in January 2025.  Return to  clinic in 6 months for routine evaluation. 2.  Osteoporosis: Patient's baseline bone mineral density on June 05, 2018 revealed a T score of -3.1.  Proceed with Prolia today.  If no improvement in her osteoporosis, can consider switching letrozole to tamoxifen.  Continue calcium and vitamin D supplementation.  Return to clinic in 6 months as above for further evaluation and Prolia.  3.  Genetic testing: Results pending at time of dictation.  I spent a total of 30 minutes face-to-face with the patient of which greater than 50% of the visit was spent in counseling and coordination of care as detailed above.   Patient expressed understanding and was in agreement with this plan. She also understands that She can call clinic at any time with any questions, concerns, or complaints.   Cancer Staging Breast cancer, left breast [facility]) Staging form: Breast,  AJCC 8th Edition - Clinical stage from 04/22/2018: Stage IB (cT2, cN0, cM0, G2, ER+, PR+, HER2-) - Signed by Lloyd Huger, MD on 04/24/2018   Lloyd Huger, MD   10/26/2018 6:23 AM

## 2018-10-23 ENCOUNTER — Other Ambulatory Visit: Payer: Self-pay

## 2018-10-23 DIAGNOSIS — C50412 Malignant neoplasm of upper-outer quadrant of left female breast: Secondary | ICD-10-CM

## 2018-10-24 ENCOUNTER — Inpatient Hospital Stay: Payer: Medicare PPO | Attending: Oncology | Admitting: Oncology

## 2018-10-24 ENCOUNTER — Inpatient Hospital Stay: Payer: Medicare PPO

## 2018-10-24 ENCOUNTER — Other Ambulatory Visit: Payer: Self-pay

## 2018-10-24 ENCOUNTER — Encounter: Payer: Self-pay | Admitting: Oncology

## 2018-10-24 VITALS — BP 140/76 | HR 69 | Temp 96.7°F | Wt 99.0 lb

## 2018-10-24 DIAGNOSIS — Z9012 Acquired absence of left breast and nipple: Secondary | ICD-10-CM | POA: Diagnosis not present

## 2018-10-24 DIAGNOSIS — C50412 Malignant neoplasm of upper-outer quadrant of left female breast: Secondary | ICD-10-CM

## 2018-10-24 DIAGNOSIS — Z170 Estrogen receptor positive status [ER+]: Secondary | ICD-10-CM | POA: Diagnosis not present

## 2018-10-24 DIAGNOSIS — Z79811 Long term (current) use of aromatase inhibitors: Secondary | ICD-10-CM | POA: Diagnosis not present

## 2018-10-24 DIAGNOSIS — M810 Age-related osteoporosis without current pathological fracture: Secondary | ICD-10-CM

## 2018-10-24 DIAGNOSIS — Z79899 Other long term (current) drug therapy: Secondary | ICD-10-CM | POA: Diagnosis not present

## 2018-10-24 LAB — CBC WITH DIFFERENTIAL/PLATELET
Abs Immature Granulocytes: 0.02 10*3/uL (ref 0.00–0.07)
Basophils Absolute: 0.1 10*3/uL (ref 0.0–0.1)
Basophils Relative: 1 %
Eosinophils Absolute: 0.3 10*3/uL (ref 0.0–0.5)
Eosinophils Relative: 4 %
HCT: 39 % (ref 36.0–46.0)
Hemoglobin: 12.2 g/dL (ref 12.0–15.0)
Immature Granulocytes: 0 %
Lymphocytes Relative: 16 %
Lymphs Abs: 1.3 10*3/uL (ref 0.7–4.0)
MCH: 28.6 pg (ref 26.0–34.0)
MCHC: 31.3 g/dL (ref 30.0–36.0)
MCV: 91.3 fL (ref 80.0–100.0)
Monocytes Absolute: 0.5 10*3/uL (ref 0.1–1.0)
Monocytes Relative: 6 %
Neutro Abs: 5.7 10*3/uL (ref 1.7–7.7)
Neutrophils Relative %: 73 %
Platelets: 262 10*3/uL (ref 150–400)
RBC: 4.27 MIL/uL (ref 3.87–5.11)
RDW: 14.1 % (ref 11.5–15.5)
WBC: 7.8 10*3/uL (ref 4.0–10.5)
nRBC: 0 % (ref 0.0–0.2)

## 2018-10-24 LAB — COMPREHENSIVE METABOLIC PANEL
ALT: 12 U/L (ref 0–44)
AST: 19 U/L (ref 15–41)
Albumin: 4.1 g/dL (ref 3.5–5.0)
Alkaline Phosphatase: 63 U/L (ref 38–126)
Anion gap: 9 (ref 5–15)
BUN: 19 mg/dL (ref 8–23)
CO2: 26 mmol/L (ref 22–32)
Calcium: 9.4 mg/dL (ref 8.9–10.3)
Chloride: 104 mmol/L (ref 98–111)
Creatinine, Ser: 0.94 mg/dL (ref 0.44–1.00)
GFR calc Af Amer: >60 mL/min (ref >60)
GFR calc non Af Amer: 55 mL/min — ABNORMAL LOW (ref >60)
Glucose, Bld: 93 mg/dL (ref 70–99)
Potassium: 4.1 mmol/L (ref 3.5–5.1)
Sodium: 139 mmol/L (ref 135–145)
Total Bilirubin: 0.5 mg/dL (ref 0.3–1.2)
Total Protein: 6 g/dL — ABNORMAL LOW (ref 6.5–8.1)

## 2018-10-24 MED ORDER — DENOSUMAB 60 MG/ML ~~LOC~~ SOSY
60.0000 mg | PREFILLED_SYRINGE | Freq: Once | SUBCUTANEOUS | Status: AC
  Administered 2018-10-24: 60 mg via SUBCUTANEOUS
  Filled 2018-10-24: qty 1

## 2018-10-24 NOTE — Progress Notes (Signed)
Patient stated that she had been doing well with no complaints. Patient had a left breast mastectomy done on 06/05/2018 by Dr. Dahlia Byes. Patient's last mammogram was done on 04/13/2018 and bone density was done on 06/05/2018.

## 2018-10-25 ENCOUNTER — Encounter: Payer: Self-pay | Admitting: Licensed Clinical Social Worker

## 2018-10-25 ENCOUNTER — Inpatient Hospital Stay (HOSPITAL_BASED_OUTPATIENT_CLINIC_OR_DEPARTMENT_OTHER): Payer: Medicare PPO | Admitting: Licensed Clinical Social Worker

## 2018-10-25 ENCOUNTER — Inpatient Hospital Stay: Payer: Medicare PPO

## 2018-10-25 DIAGNOSIS — Z803 Family history of malignant neoplasm of breast: Secondary | ICD-10-CM | POA: Diagnosis not present

## 2018-10-25 DIAGNOSIS — C50412 Malignant neoplasm of upper-outer quadrant of left female breast: Secondary | ICD-10-CM

## 2018-10-25 DIAGNOSIS — Z170 Estrogen receptor positive status [ER+]: Secondary | ICD-10-CM | POA: Diagnosis not present

## 2018-10-25 NOTE — Progress Notes (Signed)
REFERRING PROVIDER: Lloyd Huger, MD [address] [place],  [place] [address]  PRIMARY PROVIDER:  Glean Hess, MD  PRIMARY REASON FOR VISIT:  1. Malignant neoplasm of upper-outer quadrant of left breast in female, estrogen receptor positive ([place])   2. Family history of breast cancer    I connected with Christina Mccarthy and her daughter, Christina Mccarthy, on 10/25/2018 at 11:00 AM EDT by Jackquline Denmark and verified that I am speaking with the correct person using two identifiers.    Patient location: home Provider location: clinic   HISTORY OF PRESENT ILLNESS:   Christina Mccarthy, a 85 y.o. female, was seen for a [facility] cancer genetics consultation at the request of Dr. Grayland Ormond due to a personal and family history of cancer.  Christina Mccarthy presents to clinic today to discuss the possibility of a hereditary predisposition to cancer, genetic testing, and to further clarify her future cancer risks, as well as potential cancer risks for family members.   In 2019, at the age of 75, Christina Mccarthy was diagnosed with left breast cancer. The treatment plan includes mastectomy (performed on 05/17/2018), and letrozole.   CANCER HISTORY:  Oncology History  Breast cancer, left breast ([place])  03/09/2018 Initial Diagnosis   Breast cancer, left breast ([place])   04/22/2018 Cancer Staging   Staging form: Breast, AJCC 8th Edition - Clinical stage from 04/22/2018: Stage IB (cT2, cN0, cM0, G2, ER+, PR+, HER2-) - Signed by Lloyd Huger, MD on 04/24/2018    RISK FACTORS:  Menarche was at age 48.  First live birth at age 89.  OCP use for approximately 0 years.  Ovaries intact: no.  Hysterectomy: yes.  Menopausal status: postmenopausal.    Past Medical History:  Diagnosis Date  . Cancer ([place]) 04/13/2018   left breast cancer  . Depression   . Family history of breast cancer   . Hyperlipidemia   . Hypertension     Past Surgical History:  Procedure Laterality Date  . APPENDECTOMY    . BACK SURGERY    .  BREAST BIOPSY Left 04/2018  . CATARACT EXTRACTION, BILATERAL    . CHOLECYSTECTOMY    . EYE SURGERY    . HYSTERECTOMY ABDOMINAL WITH SALPINGO-OOPHORECTOMY     benign reason  . INNER EAR SURGERY Bilateral   . LUMBAR DISC SURGERY    . MASTECTOMY W/ SENTINEL NODE BIOPSY Left 05/17/2018   Procedure: MASTECTOMY WITH SENTINEL LYMPH NODE BIOPSY;  Surgeon: Jules Husbands, MD;  Location: [facility] ORS;  Service: General;  Laterality: Left;    Social History   Socioeconomic History  . Marital status: Widowed    Spouse name: Not on file  . Number of children: Not on file  . Years of education: Not on file  . Highest education level: Not on file  Occupational History  . Not on file  Social Needs  . Financial resource strain: Not on file  . Food insecurity    Worry: Not on file    Inability: Not on file  . Transportation needs    Medical: Not on file    Non-medical: Not on file  Tobacco Use  . Smoking status: Never Smoker  . Smokeless tobacco: Never Used  Substance and Sexual Activity  . Alcohol use: Never    Frequency: Never  . Drug use: Never  . Sexual activity: Not on file  Lifestyle  . Physical activity    Days per week: Not on file    Minutes per session: Not  on file  . Stress: Not on file  Relationships  . Social Herbalist on phone: Not on file    Gets together: Not on file    Attends religious service: Not on file    Active member of club or organization: Not on file    Attends meetings of clubs or organizations: Not on file    Relationship status: Not on file  Other Topics Concern  . Not on file  Social History Narrative  . Not on file     FAMILY HISTORY:  We obtained a detailed, 4-generation family history.  Significant diagnoses are listed below: Family History  Problem Relation Age of Onset  . Breast cancer Mother 29  . CAD Father   . Breast cancer Maternal Aunt        dx 46s   Christina Mccarthy has 6 children. She has 2 sons, ages 29 and 28, and 53 living  daughters, ages 73, 89, and 79. She had another daughter as well that died at 80. Christina Mccarthy had 2 brothers, both are deceased, no cancers.   Christina Mccarthy mother was diagnosed with breast cancer at 64. The patient had 1 maternal aunt who was also diagnosed with breast cancer in her 83s. No known cancers in maternal cousins. No known cancers in maternal grandparents.   Christina Mccarthy father died at 59. Patient had 1 paternal aunt, no known cancers. No known cancers in paternal cousins or grandparents.   Christina Mccarthy is unaware of previous family history of genetic testing for hereditary cancer risks. Patient's ancestors are of [place], [place], [place] descent. There is no reported Ashkenazi Jewish ancestry. There is no known consanguinity.  GENETIC COUNSELING ASSESSMENT: [name] is a 85 y.o. female with a personal and family history which is somewhat suggestive of a hereditary cancer syndrome and predisposition to cancer. We, therefore, discussed and recommended the following at today's visit.   DISCUSSION: We discussed that 5 - 10% of breast cancer is hereditary, with most cases associated with the BRCA1/BRCA2 genes.  There are other genes that can be associated with hereditary breast cancer syndromes.  These include PALB2, CHEK2, ATM, etc.    We reviewed the characteristics, features and inheritance patterns of hereditary cancer syndromes. We also discussed genetic testing, including the appropriate family members to test, the process of testing, insurance coverage and turn-around-time for results. We discussed the implications of a negative, positive and/or variant of uncertain significant result. We recommended Christina Mccarthy pursue genetic testing for the Multi-Cancer gene panel.   The Multi-Cancer Panel offered by [facility] includes sequencing and/or deletion duplication testing of the following 85 genes: AIP, ALK, APC, ATM, AXIN2,BAP1,  BARD1, BLM, BMPR1A, BRCA1, BRCA2, BRIP1, CASR, CDC73, CDH1, CDK4,  CDKN1B, CDKN1C, CDKN2A (p14ARF), CDKN2A (p16INK4a), CEBPA, CHEK2, CTNNA1, DICER1, DIS3L2, EGFR (c.2369C>T, p.Thr790Met variant only), EPCAM (Deletion/duplication testing only), FH, FLCN, GATA2, GPC3, GREM1 (Promoter region deletion/duplication testing only), HOXB13 (c.251G>A, p.Gly84Glu), HRAS, KIT, MAX, MEN1, MET, MITF (c.952G>A, p.Glu318Lys variant only), MLH1, MSH2, MSH3, MSH6, MUTYH, NBN, NF1, NF2, NTHL1, PALB2, PDGFRA, PHOX2B, PMS2, POLD1, POLE, POT1, PRKAR1A, PTCH1, PTEN, RAD50, RAD51C, RAD51D, RB1, RECQL4, RET, RNF43, RUNX1, SDHAF2, SDHA (sequence changes only), SDHB, SDHC, SDHD, SMAD4, SMARCA4, SMARCB1, SMARCE1, STK11, SUFU, TERC, TERT, TMEM127, TP53, TSC1, TSC2, VHL, WRN and WT1.   Based on Ms. Aron's personal and family history of cancer, she meets medical criteria for genetic testing. Despite that she meets criteria, she may still have an out of pocket cost..  PLAN: After considering the risks, benefits, and limitations, Ms. Joa provided informed consent to pursue genetic testing and the blood sample was sent to [facility] for analysis of the Multi-Cancer Panel. Results should be available within approximately 2-3 weeks' time, at which point they will be disclosed by telephone to Ms. Godina, as will any additional recommendations warranted by these results. Ms. Tavenner will receive a summary of her genetic counseling visit and a copy of her results once available. This information will also be available in Epic.   Lastly, we encouraged Ms. Evenson to remain in contact with cancer genetics annually so that we can continuously update the family history and inform her of any changes in cancer genetics and testing that may be of benefit for this family.   Ms. Spoto questions were answered to her satisfaction today. Our contact information was provided should additional questions or concerns arise. Thank you for the referral and allowing [place] to share in the care of your patient.    Faith Rogue, MS Genetic Counselor [email or web address] Phone: [phone]  The patient was seen for a total of 30 minutes in virtual genetic counseling.  Drs. Magrinat, Lindi Adie and/or Burr Medico were available for discussion regarding this case.

## 2018-11-28 ENCOUNTER — Other Ambulatory Visit: Payer: Self-pay | Admitting: Internal Medicine

## 2018-11-28 DIAGNOSIS — I10 Essential (primary) hypertension: Secondary | ICD-10-CM

## 2018-11-28 DIAGNOSIS — F324 Major depressive disorder, single episode, in partial remission: Secondary | ICD-10-CM

## 2018-11-28 DIAGNOSIS — E782 Mixed hyperlipidemia: Secondary | ICD-10-CM

## 2018-11-28 DIAGNOSIS — M1710 Unilateral primary osteoarthritis, unspecified knee: Secondary | ICD-10-CM

## 2019-01-02 ENCOUNTER — Ambulatory Visit (INDEPENDENT_AMBULATORY_CARE_PROVIDER_SITE_OTHER): Payer: Medicare PPO

## 2019-01-02 ENCOUNTER — Other Ambulatory Visit: Payer: Self-pay

## 2019-01-02 DIAGNOSIS — Z23 Encounter for immunization: Secondary | ICD-10-CM

## 2019-02-20 ENCOUNTER — Telehealth: Payer: Self-pay | Admitting: Internal Medicine

## 2019-02-20 NOTE — Telephone Encounter (Signed)
Called to schedule Medicare Annual Wellness Visit with Nurse Health Advisor, Kasey Uthus at [facility]. If patient returns call, please schedule AWV with [facility] ~ Any date on [facility] schedule (Mon or Wed)  °Questions regarding scheduling, please call  [phone] or Skype > [email or web address]  ° °Kathryn Brown  °Care Guide • [facility] °Trainer   Connected Care  °??[email or web address]   ??[phone]   1Skype ° ° °

## 2019-03-12 ENCOUNTER — Other Ambulatory Visit: Payer: Self-pay | Admitting: Internal Medicine

## 2019-03-12 ENCOUNTER — Ambulatory Visit (INDEPENDENT_AMBULATORY_CARE_PROVIDER_SITE_OTHER): Payer: Medicare PPO | Admitting: Internal Medicine

## 2019-03-12 ENCOUNTER — Encounter: Payer: Self-pay | Admitting: Internal Medicine

## 2019-03-12 ENCOUNTER — Other Ambulatory Visit: Payer: Self-pay

## 2019-03-12 VITALS — BP 118/78 | HR 74 | Ht <= 58 in | Wt 101.6 lb

## 2019-03-12 DIAGNOSIS — F324 Major depressive disorder, single episode, in partial remission: Secondary | ICD-10-CM

## 2019-03-12 DIAGNOSIS — E782 Mixed hyperlipidemia: Secondary | ICD-10-CM

## 2019-03-12 DIAGNOSIS — R8271 Bacteriuria: Secondary | ICD-10-CM | POA: Diagnosis not present

## 2019-03-12 DIAGNOSIS — R8281 Pyuria: Secondary | ICD-10-CM

## 2019-03-12 DIAGNOSIS — I10 Essential (primary) hypertension: Secondary | ICD-10-CM | POA: Diagnosis not present

## 2019-03-12 DIAGNOSIS — H35033 Hypertensive retinopathy, bilateral: Secondary | ICD-10-CM | POA: Diagnosis not present

## 2019-03-12 DIAGNOSIS — H35363 Drusen (degenerative) of macula, bilateral: Secondary | ICD-10-CM | POA: Diagnosis not present

## 2019-03-12 DIAGNOSIS — Z0000 Encounter for general adult medical examination without abnormal findings: Secondary | ICD-10-CM | POA: Diagnosis not present

## 2019-03-12 DIAGNOSIS — Z961 Presence of intraocular lens: Secondary | ICD-10-CM | POA: Diagnosis not present

## 2019-03-12 LAB — POCT URINALYSIS DIPSTICK
Bilirubin, UA: NEGATIVE
Glucose, UA: NEGATIVE
Ketones, UA: NEGATIVE
Nitrite, UA: NEGATIVE
Protein, UA: POSITIVE — AB
Spec Grav, UA: 1.01 (ref 1.010–1.025)
Urobilinogen, UA: 0.2 E.U./dL
pH, UA: 5 (ref 5.0–8.0)

## 2019-03-12 NOTE — Progress Notes (Signed)
Date:  03/12/2019   Name:  Christina Mccarthy   DOB:  January 31, 1930   MRN:  [id number]   Chief Complaint: Annual Exam Christina Mccarthy is a 85 y.o. female who presents today for her Complete Annual Exam. She feels well. She reports exercising rarely. She reports she is sleeping fairly well. She uses a rollator to ambulate, no falls, no chronic joint pains.  Mammogram - ordered by oncology Immunizations - Flu done, had PPV-23 about 6 yrs ago  DEXA 06/2018  Hypertension This is a chronic problem. The problem is controlled. Pertinent negatives include no chest pain, headaches, palpitations or shortness of breath. Past treatments include angiotensin blockers. The current treatment provides significant improvement.  Depression        This is a chronic problem.The problem is unchanged.  Associated symptoms include no fatigue and no headaches.  Past treatments include SSRIs - Selective serotonin reuptake inhibitors.  Compliance with treatment is good. Hyperlipidemia This is a chronic problem. The problem is controlled. Pertinent negatives include no chest pain or shortness of breath. Current antihyperlipidemic treatment includes statins. The current treatment provides significant improvement of lipids.    Lab Results  Component Value Date   CREATININE 0.94 10/24/2018   BUN 19 10/24/2018   NA 139 10/24/2018   K 4.1 10/24/2018   CL 104 10/24/2018   CO2 26 10/24/2018   Lab Results  Component Value Date   CHOL 200 (H) 03/09/2018   HDL 56 03/09/2018   LDLCALC 122 (H) 03/09/2018   TRIG 108 03/09/2018   CHOLHDL 3.6 03/09/2018   Lab Results  Component Value Date   TSH 2.900 03/09/2018   No results found for: HGBA1C   Review of Systems  Constitutional: Negative for chills, fatigue and fever.  HENT: Negative for congestion, hearing loss, tinnitus, trouble swallowing and voice change.   Eyes: Negative for visual disturbance.  Respiratory: Negative for cough, chest tightness, shortness of breath and  wheezing.   Cardiovascular: Negative for chest pain, palpitations and leg swelling.  Gastrointestinal: Negative for abdominal pain, constipation, diarrhea and vomiting.  Endocrine: Negative for polydipsia and polyuria.  Genitourinary: Negative for difficulty urinating, dysuria, frequency, genital sores, vaginal bleeding and vaginal discharge.  Musculoskeletal: Positive for arthralgias (knees). Negative for gait problem and joint swelling.  Skin: Negative for color change and rash.  Allergic/Immunologic: Negative for environmental allergies.  Neurological: Negative for dizziness, tremors, light-headedness and headaches.  Hematological: Negative for adenopathy. Does not bruise/bleed easily.  Psychiatric/Behavioral: Positive for depression. Negative for dysphoric mood and sleep disturbance. The patient is not nervous/anxious.     Patient Active Problem List   Diagnosis Date Noted  . Osteoporosis 09/27/2018  . Breast cancer, left breast ([place]) 03/09/2018  . Mixed hyperlipidemia 09/04/2017  . Essential hypertension 09/04/2017  . Primary osteoarthritis of knee 09/04/2017  . Major depressive disorder with single episode, in partial remission ([place]) 09/04/2017    No Known Allergies  Past Surgical History:  Procedure Laterality Date  . APPENDECTOMY    . BACK SURGERY    . BREAST BIOPSY Left 04/2018  . CATARACT EXTRACTION, BILATERAL    . CHOLECYSTECTOMY    . EYE SURGERY    . HYSTERECTOMY ABDOMINAL WITH SALPINGO-OOPHORECTOMY     benign reason  . INNER EAR SURGERY Bilateral   . LUMBAR DISC SURGERY    . MASTECTOMY W/ SENTINEL NODE BIOPSY Left 05/17/2018   Procedure: MASTECTOMY WITH SENTINEL LYMPH NODE BIOPSY;  Surgeon: Jules Husbands, MD;  Location: [facility] ORS;  Service: General;  Laterality: Left;    Social History   Tobacco Use  . Smoking status: Never Smoker  . Smokeless tobacco: Never Used  Substance Use Topics  . Alcohol use: Never    Frequency: Never  . Drug use: Never      Medication list has been reviewed and updated.  Current Meds  Medication Sig  . Biotin w/ Vitamins C & E (HAIR/SKIN/NAILS PO) Take 1 tablet by mouth daily.  Marland Kitchen letrozole (FEMARA) 2.5 MG tablet Take 1 tablet (2.5 mg total) by mouth daily.  Marland Kitchen losartan (COZAAR) 25 MG tablet TAKE 1 TABLET EVERY DAY  . meloxicam (MOBIC) 7.5 MG tablet TAKE 1 TABLET EVERY DAY  . Multiple Vitamins-Minerals (ICAPS AREDS 2 PO) Take 1 capsule by mouth 2 (two) times daily.   . Omega-3 Fatty Acids (FISH OIL PEARLS PO) Take 1,000 mg by mouth daily.   . pravastatin (PRAVACHOL) 40 MG tablet TAKE 1 TABLET EVERY DAY  . sertraline (ZOLOFT) 25 MG tablet TAKE 1 TABLET EVERY DAY  . sertraline (ZOLOFT) 50 MG tablet TAKE 1 TABLET EVERY DAY    PHQ 2/9 Scores 03/12/2019 03/09/2018 09/04/2017  PHQ - 2 Score 0 0 0  PHQ- 9 Score 3 0 -    BP Readings from Last 3 Encounters:  03/12/19 118/78  10/24/18 140/76  07/04/18 (!) 146/83    Physical Exam Vitals signs and nursing note reviewed.  Constitutional:      General: She is not in acute distress.    Appearance: She is well-developed.  HENT:     Head: Normocephalic and atraumatic.     Right Ear: Tympanic membrane and ear canal normal.     Left Ear: Tympanic membrane and ear canal normal.     Nose:     Right Sinus: No maxillary sinus tenderness.     Left Sinus: No maxillary sinus tenderness.  Eyes:     General: No scleral icterus.       Right eye: No discharge.        Left eye: No discharge.     Conjunctiva/sclera: Conjunctivae normal.  Neck:     Musculoskeletal: Normal range of motion. No erythema.     Thyroid: No thyromegaly.     Vascular: No carotid bruit.  Cardiovascular:     Rate and Rhythm: Normal rate and regular rhythm.     Pulses: Normal pulses.     Heart sounds: Normal heart sounds.  Pulmonary:     Effort: Pulmonary effort is normal. No respiratory distress.     Breath sounds: No wheezing.  Abdominal:     General: Abdomen is flat. Bowel sounds are normal.      Palpations: Abdomen is soft.     Tenderness: There is no abdominal tenderness.  Musculoskeletal:     Right lower leg: No edema.     Left lower leg: No edema.  Lymphadenopathy:     Cervical: No cervical adenopathy.  Skin:    General: Skin is warm and dry.     Capillary Refill: Capillary refill takes less than 2 seconds.     Findings: No rash.  Neurological:     General: No focal deficit present.     Mental Status: She is alert and oriented to person, place, and time.     Cranial Nerves: No cranial nerve deficit.     Sensory: No sensory deficit.     Gait: Gait abnormal (uses rollator).     Deep Tendon Reflexes: Reflexes are normal and  symmetric.  Psychiatric:        Speech: Speech normal.        Behavior: Behavior normal.        Thought Content: Thought content normal.     Wt Readings from Last 3 Encounters:  03/12/19 101 lb 9.6 oz (46.1 kg)  10/24/18 99 lb (44.9 kg)  07/04/18 106 lb (48.1 kg)    BP 118/78   Pulse 74   Ht 4\' 10"  (1.473 m)   Wt 101 lb 9.6 oz (46.1 kg)   SpO2 96%   BMI 21.23 kg/m   Assessment and Plan: 1. Annual physical exam Normal exam Continue healthy diet - POCT urinalysis dipstick  2. Essential hypertension Clinically stable exam with well controlled BP.   Tolerating medications, losartan 25 mg, without side effects at this time. Pt to continue current regimen and low sodium diet; benefits of regular exercise as able discussed. - CBC with Differential/Platelet - Comprehensive metabolic panel - TSH  3. Mixed hyperlipidemia Tolerating moderate dose statin medication without side effects at this time Continue same therapy without change at this time - pravachol 40 mg - Lipid panel  4. Major depressive disorder with single episode, in partial remission (HCC) Clinically stable with normal mood and affect on Zoloft 75 mg per day. No sided effects and no SI/HI.  5. Bacteriuria with pyuria UA with 4+ bacterial and TNTC WBC but pt is  asymptomatic. Will send for culture before deciding to treat - Urine Culture   Partially dictated using Dragon software. Any errors are unintentional.  Halina Maidens, MD [facility]  03/12/2019

## 2019-03-13 LAB — CBC WITH DIFFERENTIAL/PLATELET
Basophils Absolute: 0.1 10*3/uL (ref 0.0–0.2)
Basos: 1 %
EOS (ABSOLUTE): 0.2 10*3/uL (ref 0.0–0.4)
Eos: 3 %
Hematocrit: 42.2 % (ref 34.0–46.6)
Hemoglobin: 13.7 g/dL (ref 11.1–15.9)
Immature Grans (Abs): 0 10*3/uL (ref 0.0–0.1)
Immature Granulocytes: 0 %
Lymphocytes Absolute: 1.4 10*3/uL (ref 0.7–3.1)
Lymphs: 20 %
MCH: 28.6 pg (ref 26.6–33.0)
MCHC: 32.5 g/dL (ref 31.5–35.7)
MCV: 88 fL (ref 79–97)
Monocytes Absolute: 0.5 10*3/uL (ref 0.1–0.9)
Monocytes: 7 %
Neutrophils Absolute: 4.9 10*3/uL (ref 1.4–7.0)
Neutrophils: 69 %
Platelets: 249 10*3/uL (ref 150–450)
RBC: 4.79 x10E6/uL (ref 3.77–5.28)
RDW: 13.5 % (ref 11.7–15.4)
WBC: 7.2 10*3/uL (ref 3.4–10.8)

## 2019-03-13 LAB — LIPID PANEL
Chol/HDL Ratio: 3.5 ratio (ref 0.0–4.4)
Cholesterol, Total: 203 mg/dL — ABNORMAL HIGH (ref 100–199)
HDL: 58 mg/dL (ref >39)
LDL Chol Calc (NIH): 125 mg/dL — ABNORMAL HIGH (ref 0–99)
Triglycerides: 113 mg/dL (ref 0–149)
VLDL Cholesterol Cal: 20 mg/dL (ref 5–40)

## 2019-03-13 LAB — TSH: TSH: 4.95 u[IU]/mL — ABNORMAL HIGH (ref 0.450–4.500)

## 2019-03-13 LAB — COMPREHENSIVE METABOLIC PANEL
ALT: 12 IU/L (ref 0–32)
AST: 24 IU/L (ref 0–40)
Albumin/Globulin Ratio: 2.4 — ABNORMAL HIGH (ref 1.2–2.2)
Albumin: 4.4 g/dL (ref 3.6–4.6)
Alkaline Phosphatase: 70 IU/L (ref 39–117)
BUN/Creatinine Ratio: 21 (ref 12–28)
BUN: 22 mg/dL (ref 8–27)
Bilirubin Total: 0.4 mg/dL (ref 0.0–1.2)
CO2: 22 mmol/L (ref 20–29)
Calcium: 9.8 mg/dL (ref 8.7–10.3)
Chloride: 101 mmol/L (ref 96–106)
Creatinine, Ser: 1.04 mg/dL — ABNORMAL HIGH (ref 0.57–1.00)
GFR calc Af Amer: 56 mL/min/{1.73_m2} — ABNORMAL LOW (ref >59)
GFR calc non Af Amer: 48 mL/min/{1.73_m2} — ABNORMAL LOW (ref >59)
Globulin, Total: 1.8 g/dL (ref 1.5–4.5)
Glucose: 87 mg/dL (ref 65–99)
Potassium: 4.9 mmol/L (ref 3.5–5.2)
Sodium: 140 mmol/L (ref 134–144)
Total Protein: 6.2 g/dL (ref 6.0–8.5)

## 2019-03-15 LAB — URINE CULTURE

## 2019-03-19 ENCOUNTER — Other Ambulatory Visit: Payer: Self-pay | Admitting: Internal Medicine

## 2019-03-19 DIAGNOSIS — N3000 Acute cystitis without hematuria: Secondary | ICD-10-CM

## 2019-03-19 MED ORDER — CEFUROXIME AXETIL 250 MG PO TABS: 250.0000 mg | ORAL_TABLET | Freq: Two times a day (BID) | ORAL | 0 refills | Status: AC

## 2019-03-19 NOTE — Progress Notes (Signed)
Left Vm awaiting CB about labs.

## 2019-04-15 ENCOUNTER — Encounter: Payer: Self-pay | Admitting: Licensed Clinical Social Worker

## 2019-04-24 NOTE — Progress Notes (Deleted)
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    INTERVAL HISTORY: Patient returns to clinic today for further evaluation and consideration of Prolia.  She currently feels well and is asymptomatic.  She is tolerating letrozole without significant side effects. She has no neurologic complaints.  She denies any recent fevers or illnesses.  She has a good appetite and denies weight loss.  She denies any chest pain, shortness of breath, cough, or hemoptysis.  She denies any nausea, vomiting, constipation, or diarrhea.  She has no urinary complaints.  Patient feels at her baseline offers no specific complaints today.  REVIEW OF SYSTEMS:   Review of Systems  Constitutional: Negative.  Negative for fever, malaise/fatigue and weight loss.  Respiratory: Negative.  Negative for cough and shortness of breath.   Cardiovascular: Negative.  Negative for chest pain and leg swelling.  Gastrointestinal: Negative.  Negative for abdominal pain.  Genitourinary: Negative.  Negative for dysuria.  Musculoskeletal: Negative.  Negative for back pain.  Skin: Negative.  Negative for rash.  Neurological: Negative.  Negative for sensory change, focal weakness and weakness.  Psychiatric/Behavioral: Negative.  The patient is not nervous/anxious.     As per HPI. Otherwise, a complete review of systems is negative.  PAST MEDICAL HISTORY: Past Medical History:  Diagnosis Date  . Cancer ([place]) 04/13/2018   left breast cancer  . Depression   . Family history of breast cancer   . Hyperlipidemia   . Hypertension     PAST SURGICAL HISTORY: Past Surgical  History:  Procedure Laterality Date  . APPENDECTOMY    . BACK SURGERY    . BREAST BIOPSY Left 04/2018  . CATARACT EXTRACTION, BILATERAL    . CHOLECYSTECTOMY    . EYE SURGERY    . HYSTERECTOMY ABDOMINAL WITH SALPINGO-OOPHORECTOMY     benign reason  . INNER EAR SURGERY Bilateral   . LUMBAR DISC SURGERY    . MASTECTOMY W/ SENTINEL NODE BIOPSY Left 05/17/2018   Procedure: MASTECTOMY WITH SENTINEL LYMPH NODE BIOPSY;  Surgeon: Jules Husbands, MD;  Location: [facility] ORS;  Service: General;  Laterality: Left;    FAMILY HISTORY: Family History  Problem Relation Age of Onset  . Breast cancer Mother 3  . CAD Father   . Breast cancer Maternal Aunt        dx 20s    ADVANCED DIRECTIVES (Y/N):  N  HEALTH MAINTENANCE: Social History   Tobacco Use  . Smoking status: Never Smoker  . Smokeless tobacco: Never Used  Substance Use Topics  . Alcohol use: Never  . Drug use: Never     Colonoscopy:  PAP:  Bone density:  Lipid panel:  No Known Allergies  Current Outpatient Medications  Medication Sig Dispense Refill  . Biotin w/ Vitamins C & E (HAIR/SKIN/NAILS PO) Take 1 tablet by mouth daily.    Marland Kitchen letrozole (FEMARA) 2.5 MG tablet Take 1 tablet (2.5 mg total) by mouth daily. 90 tablet 3  . losartan (COZAAR) 25 MG tablet TAKE 1 TABLET EVERY DAY 90 tablet 1  . meloxicam (MOBIC) 7.5 MG tablet TAKE 1 TABLET EVERY DAY 90 tablet 1  . Multiple Vitamins-Minerals (ICAPS AREDS 2 PO) Take 1  capsule by mouth 2 (two) times daily.     . Omega-3 Fatty Acids (FISH OIL PEARLS PO) Take 1,000 mg by mouth daily.     . pravastatin (PRAVACHOL) 40 MG tablet TAKE 1 TABLET EVERY DAY 90 tablet 1  . sertraline (ZOLOFT) 25 MG tablet TAKE 1 TABLET EVERY DAY 90 tablet 1  . sertraline (ZOLOFT) 50 MG tablet TAKE 1 TABLET EVERY DAY 90 tablet 1   No current facility-administered medications for this visit.    OBJECTIVE: There were no vitals filed for this visit.   There is no height or weight on file to calculate BMI.     ECOG FS:0 - Asymptomatic  General: Well-developed, well-nourished, no acute distress. Eyes: Pink conjunctiva, anicteric sclera. HEENT: Normocephalic, moist mucous membranes. Breast: Left mastectomy. Lungs: Clear to auscultation bilaterally. Heart: Regular rate and rhythm. No rubs, murmurs, or gallops. Abdomen: Soft, nontender, nondistended. No organomegaly noted, normoactive bowel sounds. Musculoskeletal: No edema, cyanosis, or clubbing. Neuro: Alert, answering all questions appropriately. Cranial nerves grossly intact. Skin: No rashes or petechiae noted. Psych: Normal affect.  LAB RESULTS:  Lab Results  Component Value Date   NA 140 03/12/2019   K 4.9 03/12/2019   CL 101 03/12/2019   CO2 22 03/12/2019   GLUCOSE 87 03/12/2019   BUN 22 03/12/2019   CREATININE 1.04 (H) 03/12/2019   CALCIUM 9.8 03/12/2019   PROT 6.2 03/12/2019   ALBUMIN 4.4 03/12/2019   AST 24 03/12/2019   ALT 12 03/12/2019   ALKPHOS 70 03/12/2019   BILITOT 0.4 03/12/2019   GFRNONAA 48 (L) 03/12/2019   GFRAA 56 (L) 03/12/2019    Lab Results  Component Value Date   WBC 7.2 03/12/2019   NEUTROABS 4.9 03/12/2019   HGB 13.7 03/12/2019   HCT 42.2 03/12/2019   MCV 88 03/12/2019   PLT 249 03/12/2019     STUDIES: No results found.  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  Because of this, she did not require adjuvant XRT.  Oncotype DX was 13 and therefore low risk therefore patient did not require adjuvant chemotherapy.  Continue letrozole for a total of 5 years completing in January 2025.  Return to clinic in 6 months for routine evaluation. 2.  Osteoporosis: Patient's baseline bone mineral density on June 05, 2018 revealed a T score of  -3.1.  Proceed with Prolia today.  If no improvement in her osteoporosis, can consider switching letrozole to tamoxifen.  Continue calcium and vitamin D supplementation.  Return to clinic in 6 months as above for further evaluation and Prolia.  3.  Genetic testing: Results pending at time of dictation.  I spent a total of 30 minutes face-to-face with the patient of which greater than 50% of the visit was spent in counseling and coordination of care as detailed above.   Patient expressed understanding and was in agreement with this plan. She also understands that She can call clinic at any time with any questions, concerns, or complaints.   Cancer Staging Breast cancer, left breast [facility]) Staging form: Breast, AJCC 8th Edition - Clinical stage from 04/22/2018: Stage IB (cT2, cN0, cM0, G2, ER+, PR+, HER2-) - Signed by Lloyd Huger, MD on 04/24/2018   Lloyd Huger, MD  04/24/2019 3:46 PM

## 2019-04-29 ENCOUNTER — Inpatient Hospital Stay: Payer: Medicare PPO

## 2019-04-29 ENCOUNTER — Inpatient Hospital Stay: Payer: Medicare PPO | Admitting: Oncology

## 2019-05-01 ENCOUNTER — Other Ambulatory Visit: Payer: Self-pay | Admitting: Emergency Medicine

## 2019-05-01 ENCOUNTER — Encounter: Payer: Self-pay | Admitting: Oncology

## 2019-05-01 DIAGNOSIS — C50412 Malignant neoplasm of upper-outer quadrant of left female breast: Secondary | ICD-10-CM

## 2019-05-01 DIAGNOSIS — Z170 Estrogen receptor positive status [ER+]: Secondary | ICD-10-CM

## 2019-05-02 ENCOUNTER — Other Ambulatory Visit: Payer: Self-pay

## 2019-05-02 ENCOUNTER — Inpatient Hospital Stay: Payer: Medicare PPO

## 2019-05-02 ENCOUNTER — Inpatient Hospital Stay: Payer: Medicare PPO | Attending: Oncology

## 2019-05-02 ENCOUNTER — Inpatient Hospital Stay (HOSPITAL_BASED_OUTPATIENT_CLINIC_OR_DEPARTMENT_OTHER): Payer: Medicare PPO | Admitting: Oncology

## 2019-05-02 VITALS — BP 160/60 | HR 66 | Temp 97.5°F | Wt 105.3 lb

## 2019-05-02 DIAGNOSIS — M810 Age-related osteoporosis without current pathological fracture: Secondary | ICD-10-CM | POA: Insufficient documentation

## 2019-05-02 DIAGNOSIS — C50412 Malignant neoplasm of upper-outer quadrant of left female breast: Secondary | ICD-10-CM

## 2019-05-02 DIAGNOSIS — Z170 Estrogen receptor positive status [ER+]: Secondary | ICD-10-CM | POA: Diagnosis not present

## 2019-05-02 DIAGNOSIS — Z79811 Long term (current) use of aromatase inhibitors: Secondary | ICD-10-CM | POA: Insufficient documentation

## 2019-05-02 LAB — CBC WITH DIFFERENTIAL/PLATELET
Abs Immature Granulocytes: 0.02 10*3/uL (ref 0.00–0.07)
Basophils Absolute: 0.1 10*3/uL (ref 0.0–0.1)
Basophils Relative: 2 %
Eosinophils Absolute: 0.4 10*3/uL (ref 0.0–0.5)
Eosinophils Relative: 7 %
HCT: 39 % (ref 36.0–46.0)
Hemoglobin: 12.1 g/dL (ref 12.0–15.0)
Immature Granulocytes: 0 %
Lymphocytes Relative: 23 %
Lymphs Abs: 1.4 10*3/uL (ref 0.7–4.0)
MCH: 28.7 pg (ref 26.0–34.0)
MCHC: 31 g/dL (ref 30.0–36.0)
MCV: 92.6 fL (ref 80.0–100.0)
Monocytes Absolute: 0.5 10*3/uL (ref 0.1–1.0)
Monocytes Relative: 8 %
Neutro Abs: 3.7 10*3/uL (ref 1.7–7.7)
Neutrophils Relative %: 60 %
Platelets: 229 10*3/uL (ref 150–400)
RBC: 4.21 MIL/uL (ref 3.87–5.11)
RDW: 13.4 % (ref 11.5–15.5)
WBC: 6.2 10*3/uL (ref 4.0–10.5)
nRBC: 0 % (ref 0.0–0.2)

## 2019-05-02 LAB — COMPREHENSIVE METABOLIC PANEL
ALT: 14 U/L (ref 0–44)
AST: 24 U/L (ref 15–41)
Albumin: 3.9 g/dL (ref 3.5–5.0)
Alkaline Phosphatase: 54 U/L (ref 38–126)
Anion gap: 9 (ref 5–15)
BUN: 20 mg/dL (ref 8–23)
CO2: 26 mmol/L (ref 22–32)
Calcium: 8.8 mg/dL — ABNORMAL LOW (ref 8.9–10.3)
Chloride: 104 mmol/L (ref 98–111)
Creatinine, Ser: 0.82 mg/dL (ref 0.44–1.00)
GFR calc Af Amer: >60 mL/min (ref >60)
GFR calc non Af Amer: >60 mL/min (ref >60)
Glucose, Bld: 90 mg/dL (ref 70–99)
Potassium: 4.3 mmol/L (ref 3.5–5.1)
Sodium: 139 mmol/L (ref 135–145)
Total Bilirubin: 0.7 mg/dL (ref 0.3–1.2)
Total Protein: 5.9 g/dL — ABNORMAL LOW (ref 6.5–8.1)

## 2019-05-02 MED ORDER — DENOSUMAB 60 MG/ML ~~LOC~~ SOSY
60.0000 mg | PREFILLED_SYRINGE | Freq: Once | SUBCUTANEOUS | Status: AC
  Administered 2019-05-02: 11:00:00 60 mg via SUBCUTANEOUS
  Filled 2019-05-02: qty 1

## 2019-05-03 NOTE — Progress Notes (Signed)
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    INTERVAL HISTORY: Patient returns to clinic today for routine 17-monthevaluation and continuation of Prolia. She continues to feel well and remains asymptomatic. She continues to tolerate letrozole without significant side effects. She has no neurologic complaints.  She denies any recent fevers or illnesses.  She has a good appetite and denies weight loss.  She denies any chest pain, shortness of breath, cough, or hemoptysis.  She denies any nausea, vomiting, constipation, or diarrhea.  She has no urinary complaints. Patient feels at her baseline offers no specific complaints today.  REVIEW OF SYSTEMS:   Review of Systems  Constitutional: Negative.  Negative for fever, malaise/fatigue and weight loss.  Respiratory: Negative.  Negative for cough, hemoptysis and shortness of breath.   Cardiovascular: Negative.  Negative for chest pain and leg swelling.  Gastrointestinal: Negative.  Negative for abdominal pain.  Genitourinary: Negative.  Negative for dysuria.  Musculoskeletal: Negative.  Negative for back pain.  Skin: Negative.  Negative for rash.  Neurological: Negative.  Negative for dizziness, sensory change, focal weakness, weakness and headaches.  Psychiatric/Behavioral: Negative.  The patient is not nervous/anxious.     As per HPI. Otherwise, a complete review of systems is negative.  PAST MEDICAL HISTORY: Past Medical History:  Diagnosis Date  . Cancer ([place] 04/13/2018   left breast cancer  . Depression   . Family history of breast cancer   . Hyperlipidemia    . Hypertension     PAST SURGICAL HISTORY: Past Surgical History:  Procedure Laterality Date  . APPENDECTOMY    . BACK SURGERY    . BREAST BIOPSY Left 04/2018  . CATARACT EXTRACTION, BILATERAL    . CHOLECYSTECTOMY    . EYE SURGERY    . HYSTERECTOMY ABDOMINAL WITH SALPINGO-OOPHORECTOMY     benign reason  . INNER EAR SURGERY Bilateral   . LUMBAR DISC SURGERY    . MASTECTOMY W/ SENTINEL NODE BIOPSY Left 05/17/2018   Procedure: MASTECTOMY WITH SENTINEL LYMPH NODE BIOPSY;  Surgeon: PJules Husbands MD;  Location: [facility] ORS;  Service: General;  Laterality: Left;    FAMILY HISTORY: Family History  Problem Relation Age of Onset  . Breast cancer Mother 550 . CAD Father   . Breast cancer Maternal Aunt        dx 516s   ADVANCED DIRECTIVES (Y/N):  N  HEALTH MAINTENANCE: Social History   Tobacco Use  . Smoking status: Never Smoker  . Smokeless tobacco: Never Used  Substance Use Topics  . Alcohol use: Never  . Drug use: Never     Colonoscopy:  PAP:  Bone density:  Lipid panel:  No Known Allergies  Current Outpatient Medications  Medication Sig Dispense Refill  . Biotin w/ Vitamins C & E (HAIR/SKIN/NAILS PO) Take 1 tablet by mouth daily.    .Marland Kitchenletrozole (FEMARA) 2.5 MG tablet Take 1 tablet (2.5 mg total) by mouth daily. 90 tablet 3  . losartan (COZAAR) 25 MG tablet TAKE 1 TABLET EVERY DAY 90 tablet 1  . meloxicam (MOBIC) 7.5 MG tablet TAKE 1 TABLET EVERY DAY 90 tablet 1  . Multiple Vitamins-Minerals (ICAPS AREDS  2 PO) Take 1 capsule by mouth 2 (two) times daily.     . Omega-3 Fatty Acids (FISH OIL PEARLS PO) Take 1,000 mg by mouth daily.     . pravastatin (PRAVACHOL) 40 MG tablet TAKE 1 TABLET EVERY DAY 90 tablet 1  . sertraline (ZOLOFT) 25 MG tablet TAKE 1 TABLET EVERY DAY 90 tablet 1  . sertraline (ZOLOFT) 50 MG tablet TAKE 1 TABLET EVERY DAY 90 tablet 1   No current facility-administered medications for this visit.    OBJECTIVE: Vitals:   05/02/19 0945  BP: (!)  160/60  Pulse: 66  Temp: (!) 97.5 F (36.4 C)  SpO2: 98%     Body mass index is 22.01 kg/m.    ECOG FS:0 - Asymptomatic  General: Well-developed, well-nourished, no acute distress. Eyes: Pink conjunctiva, anicteric sclera. HEENT: Normocephalic, moist mucous membranes. Breast: Left mastectomy. Lungs: No audible wheezing or coughing. Heart: Regular rate and rhythm. Abdomen: Soft, nontender, no obvious distention. Musculoskeletal: No edema, cyanosis, or clubbing. Neuro: Alert, answering all questions appropriately. Cranial nerves grossly intact. Skin: No rashes or petechiae noted. Psych: Normal affect.   LAB RESULTS:  Lab Results  Component Value Date   NA 139 05/02/2019   K 4.3 05/02/2019   CL 104 05/02/2019   CO2 26 05/02/2019   GLUCOSE 90 05/02/2019   BUN 20 05/02/2019   CREATININE 0.82 05/02/2019   CALCIUM 8.8 (L) 05/02/2019   PROT 5.9 (L) 05/02/2019   ALBUMIN 3.9 05/02/2019   AST 24 05/02/2019   ALT 14 05/02/2019   ALKPHOS 54 05/02/2019   BILITOT 0.7 05/02/2019   GFRNONAA >60 05/02/2019   GFRAA >60 05/02/2019    Lab Results  Component Value Date   WBC 6.2 05/02/2019   NEUTROABS 3.7 05/02/2019   HGB 12.1 05/02/2019   HCT 39.0 05/02/2019   MCV 92.6 05/02/2019   PLT 229 05/02/2019     STUDIES: No results found.  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  Because of this, she did not require adjuvant XRT.  Oncotype DX was 13 and therefore low risk therefore patient did not require adjuvant chemotherapy. Continue letrozole for a total of 5 years completing in January 2025. Patient will require unilateral right breast mammogram in February 2021. Return to clinic in 6 months for routine evaluation.    2.  Osteoporosis: Patient's baseline bone mineral density on June 05, 2018 revealed a T score of -3.1. Proceed with Prolia today. If no improvement in her osteoporosis, can consider switching letrozole to tamoxifen.  Continue calcium and vitamin D supplementation. Repeat bone mineral density in February 2021 along with mammogram. Return to clinic in 6 months as above for continuation of treatment.  3.  Genetic testing: Pending.  I spent a total of 25 minutes face-to-face with the patient and reviewing chart data of which greater than 50% of the visit was spent in counseling and coordination of care as detailed above.   Patient expressed understanding and was in agreement with this plan. She also understands that She can call clinic at any time with any questions, concerns, or complaints.   Cancer Staging Breast cancer, left breast [facility]) Staging form: Breast, AJCC 8th Edition - Clinical stage from 04/22/2018: Stage  IB (cT2, cN0, cM0, G2, ER+, PR+, HER2-) - Signed by Lloyd Huger, MD on 04/24/2018   Lloyd Huger, MD   05/03/2019 8:45 AM

## 2019-05-26 ENCOUNTER — Other Ambulatory Visit: Payer: Self-pay | Admitting: Oncology

## 2019-06-12 ENCOUNTER — Other Ambulatory Visit: Payer: Medicare PPO

## 2019-06-14 ENCOUNTER — Encounter: Payer: Self-pay | Admitting: *Deleted

## 2019-06-16 ENCOUNTER — Ambulatory Visit: Payer: Medicare PPO | Attending: Internal Medicine

## 2019-06-16 DIAGNOSIS — Z23 Encounter for immunization: Secondary | ICD-10-CM | POA: Insufficient documentation

## 2019-06-16 NOTE — Progress Notes (Signed)
   Covid-19 Vaccination Clinic  Name:  Christina Mccarthy    MRN: [id number] DOB: Nov 04, 1929  06/16/2019  [name] was observed post Covid-19 immunization for 15 minutes without incidence. She was provided with Vaccine Information Sheet and instruction to access the V-Safe system.   [name] was instructed to call 911 with any severe reactions post vaccine: Marland Kitchen Difficulty breathing  . Swelling of your face and throat  . A fast heartbeat  . A bad rash all over your body  . Dizziness and weakness    Immunizations Administered    Name Date Dose VIS Date Route   Pfizer COVID-19 Vaccine 06/16/2019 11:47 AM 0.3 mL 04/12/2019 Intramuscular   Manufacturer: [place]   Lot: [id number]   [place]: [id number]

## 2019-06-19 ENCOUNTER — Other Ambulatory Visit: Payer: Medicare PPO

## 2019-06-19 ENCOUNTER — Inpatient Hospital Stay: Admission: RE | Admit: 2019-06-19 | Payer: Medicare PPO | Source: Ambulatory Visit

## 2019-06-20 ENCOUNTER — Other Ambulatory Visit: Payer: Medicare PPO

## 2019-06-20 ENCOUNTER — Inpatient Hospital Stay: Admission: RE | Admit: 2019-06-20 | Payer: Medicare PPO | Source: Ambulatory Visit

## 2019-07-16 ENCOUNTER — Ambulatory Visit: Payer: Medicare PPO | Attending: Internal Medicine

## 2019-07-16 DIAGNOSIS — Z23 Encounter for immunization: Secondary | ICD-10-CM

## 2019-07-16 NOTE — Progress Notes (Signed)
   Covid-19 Vaccination Clinic  Name:  Brehana Schmuck    MRN: [id number] DOB: 06-Mar-1930  07/16/2019  Ms. Klecker was observed post Covid-19 immunization for 15 minutes without incident. She was provided with Vaccine Information Sheet and instruction to access the V-Safe system.   Ms. Santin was instructed to call 911 with any severe reactions post vaccine: Marland Kitchen Difficulty breathing  . Swelling of face and throat  . A fast heartbeat  . A bad rash all over body  . Dizziness and weakness   Immunizations Administered    Name Date Dose VIS Date Route   Pfizer COVID-19 Vaccine 07/16/2019  1:28 PM 0.3 mL 04/12/2019 Intramuscular   Manufacturer: [place]   Lot: [id number]   [place]: [id number]

## 2019-08-08 ENCOUNTER — Encounter: Payer: Self-pay | Admitting: Internal Medicine

## 2019-08-09 ENCOUNTER — Other Ambulatory Visit: Payer: Self-pay | Admitting: Internal Medicine

## 2019-08-09 DIAGNOSIS — L600 Ingrowing nail: Secondary | ICD-10-CM

## 2019-08-19 DIAGNOSIS — L03031 Cellulitis of right toe: Secondary | ICD-10-CM | POA: Diagnosis not present

## 2019-08-21 ENCOUNTER — Ambulatory Visit: Payer: Medicare PPO

## 2019-08-21 ENCOUNTER — Other Ambulatory Visit: Payer: Medicare PPO

## 2019-08-26 ENCOUNTER — Other Ambulatory Visit: Payer: Self-pay

## 2019-08-26 ENCOUNTER — Ambulatory Visit
Admission: RE | Admit: 2019-08-26 | Discharge: 2019-08-26 | Disposition: A | Discharge status: Home / Self Care | Payer: Medicare PPO | Source: Ambulatory Visit | Attending: Oncology | Admitting: Oncology

## 2019-08-26 DIAGNOSIS — M810 Age-related osteoporosis without current pathological fracture: Secondary | ICD-10-CM | POA: Diagnosis not present

## 2019-08-26 DIAGNOSIS — Z853 Personal history of malignant neoplasm of breast: Secondary | ICD-10-CM | POA: Diagnosis not present

## 2019-08-26 DIAGNOSIS — C50412 Malignant neoplasm of upper-outer quadrant of left female breast: Secondary | ICD-10-CM

## 2019-08-26 DIAGNOSIS — Z170 Estrogen receptor positive status [ER+]: Secondary | ICD-10-CM

## 2019-08-26 DIAGNOSIS — Z1231 Encounter for screening mammogram for malignant neoplasm of breast: Secondary | ICD-10-CM | POA: Insufficient documentation

## 2019-08-27 ENCOUNTER — Other Ambulatory Visit: Payer: Self-pay | Admitting: Oncology

## 2019-08-27 MED ORDER — TAMOXIFEN CITRATE 20 MG PO TABS
20.0000 mg | ORAL_TABLET | Freq: Every day | ORAL | 3 refills | Status: DC
Start: 2019-08-27 — End: 2019-08-28

## 2019-08-28 ENCOUNTER — Other Ambulatory Visit: Payer: Self-pay | Admitting: *Deleted

## 2019-08-28 MED ORDER — TAMOXIFEN CITRATE 20 MG PO TABS: 20.0000 mg | ORAL_TABLET | Freq: Every day | ORAL | 0 refills | Status: DC

## 2019-09-02 DIAGNOSIS — L03031 Cellulitis of right toe: Secondary | ICD-10-CM | POA: Diagnosis not present

## 2019-09-11 ENCOUNTER — Ambulatory Visit: Payer: Medicare PPO | Admitting: Internal Medicine

## 2019-09-16 DIAGNOSIS — L03031 Cellulitis of right toe: Secondary | ICD-10-CM | POA: Diagnosis not present

## 2019-10-08 ENCOUNTER — Encounter: Payer: Self-pay | Admitting: Internal Medicine

## 2019-10-08 ENCOUNTER — Other Ambulatory Visit: Payer: Self-pay

## 2019-10-08 ENCOUNTER — Ambulatory Visit (INDEPENDENT_AMBULATORY_CARE_PROVIDER_SITE_OTHER): Payer: Medicare PPO | Admitting: Internal Medicine

## 2019-10-08 ENCOUNTER — Other Ambulatory Visit: Payer: Self-pay | Admitting: Internal Medicine

## 2019-10-08 VITALS — BP 98/62 | HR 68 | Temp 98.2°F | Ht <= 58 in | Wt 102.0 lb

## 2019-10-08 DIAGNOSIS — I10 Essential (primary) hypertension: Secondary | ICD-10-CM

## 2019-10-08 DIAGNOSIS — M1710 Unilateral primary osteoarthritis, unspecified knee: Secondary | ICD-10-CM

## 2019-10-08 DIAGNOSIS — F324 Major depressive disorder, single episode, in partial remission: Secondary | ICD-10-CM | POA: Diagnosis not present

## 2019-10-08 DIAGNOSIS — D485 Neoplasm of uncertain behavior of skin: Secondary | ICD-10-CM

## 2019-10-08 DIAGNOSIS — E782 Mixed hyperlipidemia: Secondary | ICD-10-CM

## 2019-10-08 DIAGNOSIS — M810 Age-related osteoporosis without current pathological fracture: Secondary | ICD-10-CM | POA: Diagnosis not present

## 2019-10-08 MED ORDER — LOSARTAN POTASSIUM 25 MG PO TABS: 25.0000 mg | ORAL_TABLET | Freq: Every day | ORAL | 0 refills | Status: DC

## 2019-10-08 NOTE — Progress Notes (Deleted)
Double masectomy

## 2019-10-08 NOTE — Patient Instructions (Signed)
[facility]  [phone] ?

## 2019-10-08 NOTE — Progress Notes (Signed)
Date:  10/08/2019   Name:  Christina Mccarthy   DOB:  07-Nov-1929   MRN:  [id number]   Chief Complaint: Hypertension (Follow up. Prevnar 13. )  Hypertension This is a chronic problem. The problem is controlled. Pertinent negatives include no chest pain, headaches, palpitations or shortness of breath. There are no known risk factors for coronary artery disease. Past treatments include angiotensin blockers.  Depression        This is a chronic problem.  The problem has been resolved since onset.  Associated symptoms include no decreased concentration, no fatigue, no helplessness, does not have insomnia, no decreased interest, no headaches, not sad and no suicidal ideas.  Past treatments include SSRIs - Selective serotonin reuptake inhibitors.  Compliance with treatment is good. Rash This is a chronic problem. The affected locations include the face. The rash is characterized by dryness and scaling. She was exposed to nothing. Pertinent negatives include no cough, diarrhea, fatigue, fever or shortness of breath.    Lab Results  Component Value Date   CREATININE 0.82 05/02/2019   BUN 20 05/02/2019   NA 139 05/02/2019   K 4.3 05/02/2019   CL 104 05/02/2019   CO2 26 05/02/2019   Lab Results  Component Value Date   CHOL 203 (H) 03/12/2019   HDL 58 03/12/2019   LDLCALC 125 (H) 03/12/2019   TRIG 113 03/12/2019   CHOLHDL 3.5 03/12/2019   Lab Results  Component Value Date   TSH 4.950 (H) 03/12/2019   No results found for: HGBA1C Lab Results  Component Value Date   WBC 6.2 05/02/2019   HGB 12.1 05/02/2019   HCT 39.0 05/02/2019   MCV 92.6 05/02/2019   PLT 229 05/02/2019   Lab Results  Component Value Date   ALT 14 05/02/2019   AST 24 05/02/2019   ALKPHOS 54 05/02/2019   BILITOT 0.7 05/02/2019     Review of Systems  Constitutional: Negative for chills, fatigue and fever.  Respiratory: Negative for cough, chest tightness, shortness of breath and wheezing.   Cardiovascular:  Negative for chest pain, palpitations and leg swelling.  Gastrointestinal: Negative for abdominal pain, constipation and diarrhea.  Skin: Positive for rash.  Neurological: Negative for dizziness and headaches.  Psychiatric/Behavioral: Positive for depression. Negative for decreased concentration, dysphoric mood and suicidal ideas. The patient is not nervous/anxious and does not have insomnia.     Patient Active Problem List   Diagnosis Date Noted  . Osteoporosis 09/27/2018  . Breast cancer, left breast ([place]) 03/09/2018  . Mixed hyperlipidemia 09/04/2017  . Essential hypertension 09/04/2017  . Primary osteoarthritis of knee 09/04/2017  . Major depressive disorder with single episode, in partial remission ([place]) 09/04/2017    No Known Allergies  Past Surgical History:  Procedure Laterality Date  . ABDOMINAL HYSTERECTOMY    . APPENDECTOMY    . BACK SURGERY    . BREAST BIOPSY Left 04/2018  . CATARACT EXTRACTION, BILATERAL    . CHOLECYSTECTOMY    . EYE SURGERY    . HYSTERECTOMY ABDOMINAL WITH SALPINGO-OOPHORECTOMY     benign reason  . INNER EAR SURGERY Bilateral   . LUMBAR DISC SURGERY    . MASTECTOMY W/ SENTINEL NODE BIOPSY Left 05/17/2018   Procedure: MASTECTOMY WITH SENTINEL LYMPH NODE BIOPSY;  Surgeon: Jules Husbands, MD;  Location: [facility] ORS;  Service: General;  Laterality: Left;    Social History   Tobacco Use  . Smoking status: Never Smoker  . Smokeless tobacco: Never Used  Substance Use Topics  . Alcohol use: Never  . Drug use: Never     Medication list has been reviewed and updated.  Current Meds  Medication Sig  . Biotin w/ Vitamins C & E (HAIR/SKIN/NAILS PO) Take 1 tablet by mouth daily.  Marland Kitchen denosumab (PROLIA) 60 MG/ML SOSY injection Inject 60 mg into the skin every 6 (six) months.  Marland Kitchen losartan (COZAAR) 25 MG tablet TAKE 1 TABLET EVERY DAY  . meloxicam (MOBIC) 7.5 MG tablet TAKE 1 TABLET EVERY DAY  . Multiple Vitamins-Minerals (ICAPS AREDS 2 PO) Take 1 capsule  by mouth 2 (two) times daily.   . Omega-3 Fatty Acids (FISH OIL PEARLS PO) Take 1,000 mg by mouth daily.   . pravastatin (PRAVACHOL) 40 MG tablet TAKE 1 TABLET EVERY DAY  . sertraline (ZOLOFT) 25 MG tablet TAKE 1 TABLET EVERY DAY  . sertraline (ZOLOFT) 50 MG tablet TAKE 1 TABLET EVERY DAY  . tamoxifen (NOLVADEX) 20 MG tablet Take 1 tablet (20 mg total) by mouth daily.  . [DISCONTINUED] alendronate (FOSAMAX) 70 MG tablet once a week.    PHQ 2/9 Scores 10/08/2019 03/12/2019 03/09/2018 09/04/2017  PHQ - 2 Score 0 0 0 0  PHQ- 9 Score 2 3 0 -    BP Readings from Last 3 Encounters:  10/08/19 98/62  05/02/19 (!) 160/60  03/12/19 118/78    Physical Exam Vitals and nursing note reviewed.  Constitutional:      General: She is not in acute distress.    Appearance: She is well-developed.  HENT:     Head: Normocephalic and atraumatic.  Cardiovascular:     Rate and Rhythm: Normal rate and regular rhythm.     Pulses: Normal pulses.     Heart sounds: No murmur.  Pulmonary:     Effort: Pulmonary effort is normal. No respiratory distress.     Breath sounds: No wheezing or rhonchi.  Musculoskeletal:     Right lower leg: No edema.     Left lower leg: No edema.  Skin:    General: Skin is warm and dry.     Capillary Refill: Capillary refill takes less than 2 seconds.     Findings: No rash.     Comments: Several raised scaly lesions on forehead  Neurological:     General: No focal deficit present.     Mental Status: She is alert and oriented to person, place, and time.  Psychiatric:        Behavior: Behavior normal.        Thought Content: Thought content normal.     Wt Readings from Last 3 Encounters:  10/08/19 102 lb (46.3 kg)  05/02/19 105 lb 4.8 oz (47.8 kg)  03/12/19 101 lb 9.6 oz (46.1 kg)    BP 98/62 (BP Location: Right Arm, Patient Position: Sitting, Cuff Size: Normal)   Pulse 68   Temp 98.2 F (36.8 C) (Oral)   Ht 4\' 10"  (1.473 m)   Wt 102 lb (46.3 kg)   SpO2 100%   BMI  21.32 kg/m   Assessment and Plan: 1. Essential hypertension Clinically stable exam with well controlled BP on losartan. Tolerating medications without side effects at this time. Pt to continue current regimen and low sodium diet; benefits of regular exercise as able discussed.  2. Major depressive disorder with single episode, in partial remission (HCC) Clinically stable on current regimen with good control of symptoms, No SI or HI. Will continue current therapy.  3. Osteoporosis without current pathological fracture,  unspecified osteoporosis type On Prolia from Oncology  4. Neoplasm of uncertain behavior of skin Recommend Dermatology evaluation   Partially dictated using Dragon software. Any errors are unintentional.  Halina Maidens, MD [facility]  10/08/2019

## 2019-10-08 NOTE — Telephone Encounter (Signed)
Requested Prescriptions  Pending Prescriptions Disp Refills  . meloxicam (MOBIC) 7.5 MG tablet [Pharmacy Med Name: MELOXICAM 7.5 MG Tablet] 90 tablet 2    Sig: TAKE 1 TABLET EVERY DAY     Analgesics:  COX2 Inhibitors Passed - 10/08/2019  9:22 PM      Passed - HGB in normal range and within 360 days    Hemoglobin  Date Value Ref Range Status  05/02/2019 12.1 12.0 - 15.0 g/dL Final  03/12/2019 13.7 11.1 - 15.9 g/dL Final         Passed - Cr in normal range and within 360 days    Creatinine, Ser  Date Value Ref Range Status  05/02/2019 0.82 0.44 - 1.00 mg/dL Final         Passed - Patient is not pregnant      Passed - Valid encounter within last 12 months    Recent Outpatient Visits          Today Essential hypertension   [facility] Glean Hess, MD   7 months ago Annual physical exam   [facility] Glean Hess, MD   1 year ago Annual physical exam   [facility] Glean Hess, MD   2 years ago Essential hypertension   [facility] Glean Hess, MD      Future Appointments            In 5 months Army Melia Jesse Sans, MD [facility], [place]           . losartan (COZAAR) 25 MG tablet [Pharmacy Med Name: LOSARTAN POTASSIUM 25 MG Tablet] 90 tablet     Sig: TAKE 1 TABLET EVERY DAY     Cardiovascular:  Angiotensin Receptor Blockers Passed - 10/08/2019  9:22 PM      Passed - Cr in normal range and within 180 days    Creatinine, Ser  Date Value Ref Range Status  05/02/2019 0.82 0.44 - 1.00 mg/dL Final         Passed - K in normal range and within 180 days    Potassium  Date Value Ref Range Status  05/02/2019 4.3 3.5 - 5.1 mmol/L Final         Passed - Patient is not pregnant      Passed - Last BP in normal range    BP Readings from Last 1 Encounters:  10/08/19 98/62         Passed - Valid encounter within last 6 months    Recent Outpatient Visits          Today Essential hypertension   [facility] Glean Hess, MD   7 months ago Annual physical exam   [facility] Glean Hess, MD   1 year ago Annual physical exam   [facility] Glean Hess, MD   2 years ago Essential hypertension   [facility] Glean Hess, MD      Future Appointments            In 5 months Army Melia Jesse Sans, MD [facility], [place]           . sertraline (ZOLOFT) 25 MG tablet [Pharmacy Med Name: SERTRALINE HCL 25 MG Tablet] 90 tablet 1    Sig: TAKE 1 TABLET EVERY DAY     Psychiatry:  Antidepressants - SSRI Passed - 10/08/2019  9:22 PM      Passed - Completed PHQ-2  or PHQ-9 in the last 360 days.      Passed - Valid encounter within last 6 months    Recent Outpatient Visits          Today Essential hypertension   [facility] Glean Hess, MD   7 months ago Annual physical exam   [facility] Glean Hess, MD   1 year ago Annual physical exam   [facility] Glean Hess, MD   2 years ago Essential hypertension   [facility] Glean Hess, MD      Future Appointments            In 5 months Army Melia Jesse Sans, MD [facility], [place]           . sertraline (ZOLOFT) 50 MG tablet [Pharmacy Med Name: SERTRALINE HCL 50 MG Tablet] 90 tablet 1    Sig: TAKE 1 TABLET EVERY DAY     Psychiatry:  Antidepressants - SSRI Passed - 10/08/2019  9:22 PM      Passed - Completed PHQ-2 or PHQ-9 in the last 360 days.      Passed - Valid encounter within last 6 months    Recent Outpatient Visits          Today Essential hypertension   [facility] Glean Hess, MD   7 months ago Annual physical exam   [facility] Glean Hess, MD   1 year ago Annual physical exam   [facility] Glean Hess, MD   2 years ago Essential hypertension   [facility] Glean Hess, MD      Future Appointments            In 5 months Army Melia Jesse Sans,  MD [facility], [place]           . pravastatin (PRAVACHOL) 40 MG tablet [Pharmacy Med Name: PRAVASTATIN SODIUM 40 MG Tablet] 90 tablet 1    Sig: TAKE 1 TABLET EVERY DAY     Cardiovascular:  Antilipid - Statins Failed - 10/08/2019  9:22 PM      Failed - Total Cholesterol in normal range and within 360 days    Cholesterol, Total  Date Value Ref Range Status  03/12/2019 203 (H) 100 - 199 mg/dL Final         Failed - LDL in normal range and within 360 days    LDL Chol Calc (NIH)  Date Value Ref Range Status  03/12/2019 125 (H) 0 - 99 mg/dL Final         Passed - HDL in normal range and within 360 days    HDL  Date Value Ref Range Status  03/12/2019 58 >39 mg/dL Final         Passed - Triglycerides in normal range and within 360 days    Triglycerides  Date Value Ref Range Status  03/12/2019 113 0 - 149 mg/dL Final         Passed - Patient is not pregnant      Passed - Valid encounter within last 12 months    Recent Outpatient Visits          Today Essential hypertension   [facility] Glean Hess, MD   7 months ago Annual physical exam   [facility] Glean Hess, MD   1 year ago Annual physical exam   [facility] Glean Hess, MD  2 years ago Essential hypertension   [facility] Glean Hess, MD      Future Appointments            In 5 months Army Melia Jesse Sans, MD [facility], [facility]           Losartan Rx sent to local Pharmacy earlier today on 10/08/2019.  This Refill to mail order pharmacy.

## 2019-10-14 ENCOUNTER — Telehealth: Payer: Self-pay | Admitting: Internal Medicine

## 2019-10-14 NOTE — Telephone Encounter (Signed)
Left message for patient to call back and schedule Medicare Annual Wellness Visit (AWV) either virtually/audio only or in office. Whichever the patients preference is.  No history of AWV; please schedule at anytime with [facility] Health Advisor.  This should be a 40 minute visit

## 2019-10-24 NOTE — Progress Notes (Signed)
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    INTERVAL HISTORY: Patient returns to clinic today for routine 3-monthevaluation and continuation of Prolia.  Letrozole was discontinued and tamoxifen was initiated secondary to worsening osteoporosis.  She is tolerating her treatments well without significant side effects.  She currently feels well and is asymptomatic. She has no neurologic complaints.  She denies any recent fevers or illnesses.  She has a good appetite and denies weight loss. She denies any chest pain, shortness of breath, cough, or hemoptysis.  She denies any nausea, vomiting, constipation, or diarrhea.  She has no urinary complaints.  Patient offers no specific complaints today.  REVIEW OF SYSTEMS:   Review of Systems  Constitutional: Negative.  Negative for fever, malaise/fatigue and weight loss.  Respiratory: Negative.  Negative for cough, hemoptysis and shortness of breath.   Cardiovascular: Negative.  Negative for chest pain and leg swelling.  Gastrointestinal: Negative.  Negative for abdominal pain.  Genitourinary: Negative.  Negative for dysuria.  Musculoskeletal: Negative.  Negative for back pain.  Skin: Negative.  Negative for rash.  Neurological: Negative.  Negative for dizziness, sensory change, focal weakness, weakness and headaches.  Psychiatric/Behavioral: Negative.  The patient is not nervous/anxious.     As per HPI. Otherwise, a complete review of systems is negative.  PAST MEDICAL HISTORY: Past Medical History:  Diagnosis Date  . [place]   [place]  . [place]   left breast cancer  . Depression   . Family history of breast cancer   . Hyperlipidemia   . Hypertension     PAST SURGICAL HISTORY: Past Surgical History:  Procedure Laterality Date  . ABDOMINAL HYSTERECTOMY    . APPENDECTOMY    . BACK SURGERY    . BREAST BIOPSY Left 04/2018  . CATARACT EXTRACTION, BILATERAL    . CHOLECYSTECTOMY    . EYE SURGERY    . HYSTERECTOMY ABDOMINAL WITH SALPINGO-OOPHORECTOMY     benign reason  . INNER EAR SURGERY Bilateral   . LUMBAR DISC SURGERY    . MASTECTOMY W/ SENTINEL NODE BIOPSY Left 05/17/2018   Procedure: MASTECTOMY WITH SENTINEL LYMPH NODE BIOPSY;  Surgeon: PJules Husbands MD;  Location: [facility] ORS;  Service: General;  Laterality: Left;    FAMILY HISTORY: Family History  Problem Relation Age of Onset  . Breast cancer Mother 531 . CAD Father   . Breast cancer Maternal Aunt        dx 559s   ADVANCED DIRECTIVES (Y/N):  N  HEALTH MAINTENANCE: Social History   Tobacco Use  . Smoking status: Never Smoker  . Smokeless tobacco: Never Used  Vaping Use  . Vaping Use: Never used  Substance Use Topics  . Alcohol use: Never  . Drug use: Never     Colonoscopy:  PAP:  Bone density:  Lipid panel:  No Known Allergies  Current Outpatient Medications  Medication Sig Dispense Refill  . Biotin w/ Vitamins C & E (HAIR/SKIN/NAILS PO) Take 1 tablet by mouth daily.    .Marland Kitchendenosumab (PROLIA) 60 MG/ML SOSY injection Inject 60 mg into the skin every 6 (six) months.    .Marland Kitchen  losartan (COZAAR) 25 MG tablet TAKE 1 TABLET EVERY DAY 90 tablet 1  . meloxicam (MOBIC) 7.5 MG tablet TAKE 1 TABLET EVERY DAY 90 tablet 2  . Multiple Vitamins-Minerals (ICAPS AREDS 2 PO) Take 1 capsule by mouth 2 (two) times daily.     . Omega-3 Fatty Acids (FISH OIL PEARLS PO) Take 1,000 mg by mouth daily.     . pravastatin (PRAVACHOL) 40 MG tablet TAKE 1 TABLET EVERY DAY 90 tablet 1  . sertraline (ZOLOFT) 25 MG tablet TAKE 1 TABLET EVERY DAY 90 tablet 1  .  sertraline (ZOLOFT) 50 MG tablet TAKE 1 TABLET EVERY DAY 90 tablet 1  . tamoxifen (NOLVADEX) 20 MG tablet Take 1 tablet (20 mg total) by mouth daily. 90 tablet 0   No current facility-administered medications for this visit.    OBJECTIVE: Vitals:   10/30/19 1111  BP: 140/67  Pulse: 64  Temp: 98 F (36.7 C)  SpO2: 100%     Body mass index is 22.13 kg/m.    ECOG FS:0 - Asymptomatic  General: Well-developed, well-nourished, no acute distress. Eyes: Pink conjunctiva, anicteric sclera. HEENT: Normocephalic, moist mucous membranes. Lungs: No audible wheezing or coughing. Heart: Regular rate and rhythm. Abdomen: Soft, nontender, no obvious distention. Musculoskeletal: No edema, cyanosis, or clubbing. Neuro: Alert, answering all questions appropriately. Cranial nerves grossly intact. Skin: No rashes or petechiae noted. Psych: Normal affect.  LAB RESULTS:  Lab Results  Component Value Date   NA 140 10/30/2019   K 4.4 10/30/2019   CL 104 10/30/2019   CO2 28 10/30/2019   GLUCOSE 96 10/30/2019   BUN 23 10/30/2019   CREATININE 0.98 10/30/2019   CALCIUM 8.9 10/30/2019   PROT 6.3 (L) 10/30/2019   ALBUMIN 4.0 10/30/2019   AST 21 10/30/2019   ALT 9 10/30/2019   ALKPHOS 41 10/30/2019   BILITOT 0.6 10/30/2019   GFRNONAA 52 (L) 10/30/2019   GFRAA 60 (L) 10/30/2019    Lab Results  Component Value Date   WBC 6.8 10/30/2019   NEUTROABS 4.6 10/30/2019   HGB 12.9 10/30/2019   HCT 39.3 10/30/2019   MCV 90.3 10/30/2019   PLT 203 10/30/2019     STUDIES: No results found.  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  Because of this, she did not require adjuvant XRT.  Oncotype DX was 13 and therefore low risk therefore  patient did not require adjuvant chemotherapy.  Letrozole was discontinued secondary to osteoporosis and patient was recently initiated on tamoxifen.  Continue treatment for total 5 years completing in January 2025.  Her most recent right sided screening mammogram on August 26, 2019 was reported as BI-RADS 1.  Repeat in April 2022.  Return to clinic in 6 months for routine evaluation.   2.  Osteoporosis: Patient's most recent bone mineral density on October 26, 2019 reported T score of -3.4 which is worse than previous.  Discontinue letrozole for tamoxifen as above.  Proceed with Prolia today. Continue calcium and vitamin D supplementation.  Return to clinic in 6 months for routine evaluation and continuation of treatment. 3.  Genetic testing: Per patient request, she was previously given a referral to defer genetic counseling.  I spent a total of 30 minutes  reviewing chart data, face-to-face evaluation with the patient, counseling and coordination of care as detailed above.    Patient expressed understanding and was in agreement with this plan. She also understands that She can call clinic at any time with any questions, concerns, or complaints.   Cancer Staging Breast cancer, left breast (HCC) Staging form: Breast, AJCC 8th Edition - Clinical stage from 04/22/2018: Stage IB (cT2, cN0, cM0, G2, ER+, PR+, HER2-) - Signed by Finnegan, Timothy J, MD on 04/24/2018   Timothy J Finnegan, MD   10/31/2019 12:29 PM     

## 2019-10-29 ENCOUNTER — Other Ambulatory Visit: Payer: Self-pay | Admitting: Emergency Medicine

## 2019-10-29 DIAGNOSIS — Z170 Estrogen receptor positive status [ER+]: Secondary | ICD-10-CM

## 2019-10-30 ENCOUNTER — Inpatient Hospital Stay: Payer: Medicare PPO

## 2019-10-30 ENCOUNTER — Other Ambulatory Visit: Payer: Self-pay

## 2019-10-30 ENCOUNTER — Inpatient Hospital Stay (HOSPITAL_BASED_OUTPATIENT_CLINIC_OR_DEPARTMENT_OTHER): Payer: Medicare PPO | Admitting: Oncology

## 2019-10-30 ENCOUNTER — Inpatient Hospital Stay: Payer: Medicare PPO | Attending: Oncology

## 2019-10-30 ENCOUNTER — Encounter: Payer: Self-pay | Admitting: Oncology

## 2019-10-30 VITALS — BP 140/67 | HR 64 | Temp 98.0°F | Wt 105.9 lb

## 2019-10-30 DIAGNOSIS — Z170 Estrogen receptor positive status [ER+]: Secondary | ICD-10-CM | POA: Diagnosis not present

## 2019-10-30 DIAGNOSIS — C50412 Malignant neoplasm of upper-outer quadrant of left female breast: Secondary | ICD-10-CM | POA: Insufficient documentation

## 2019-10-30 DIAGNOSIS — M810 Age-related osteoporosis without current pathological fracture: Secondary | ICD-10-CM | POA: Diagnosis not present

## 2019-10-30 DIAGNOSIS — Z79899 Other long term (current) drug therapy: Secondary | ICD-10-CM | POA: Insufficient documentation

## 2019-10-30 DIAGNOSIS — Z79810 Long term (current) use of selective estrogen receptor modulators (SERMs): Secondary | ICD-10-CM | POA: Diagnosis not present

## 2019-10-30 LAB — COMPREHENSIVE METABOLIC PANEL
ALT: 9 U/L (ref 0–44)
AST: 21 U/L (ref 15–41)
Albumin: 4 g/dL (ref 3.5–5.0)
Alkaline Phosphatase: 41 U/L (ref 38–126)
Anion gap: 8 (ref 5–15)
BUN: 23 mg/dL (ref 8–23)
CO2: 28 mmol/L (ref 22–32)
Calcium: 8.9 mg/dL (ref 8.9–10.3)
Chloride: 104 mmol/L (ref 98–111)
Creatinine, Ser: 0.98 mg/dL (ref 0.44–1.00)
GFR calc Af Amer: 60 mL/min — ABNORMAL LOW (ref >60)
GFR calc non Af Amer: 52 mL/min — ABNORMAL LOW (ref >60)
Glucose, Bld: 96 mg/dL (ref 70–99)
Potassium: 4.4 mmol/L (ref 3.5–5.1)
Sodium: 140 mmol/L (ref 135–145)
Total Bilirubin: 0.6 mg/dL (ref 0.3–1.2)
Total Protein: 6.3 g/dL — ABNORMAL LOW (ref 6.5–8.1)

## 2019-10-30 LAB — CBC WITH DIFFERENTIAL/PLATELET
Abs Immature Granulocytes: 0.03 10*3/uL (ref 0.00–0.07)
Basophils Absolute: 0.1 10*3/uL (ref 0.0–0.1)
Basophils Relative: 1 %
Eosinophils Absolute: 0.4 10*3/uL (ref 0.0–0.5)
Eosinophils Relative: 6 %
HCT: 39.3 % (ref 36.0–46.0)
Hemoglobin: 12.9 g/dL (ref 12.0–15.0)
Immature Granulocytes: 0 %
Lymphocytes Relative: 18 %
Lymphs Abs: 1.2 10*3/uL (ref 0.7–4.0)
MCH: 29.7 pg (ref 26.0–34.0)
MCHC: 32.8 g/dL (ref 30.0–36.0)
MCV: 90.3 fL (ref 80.0–100.0)
Monocytes Absolute: 0.5 10*3/uL (ref 0.1–1.0)
Monocytes Relative: 7 %
Neutro Abs: 4.6 10*3/uL (ref 1.7–7.7)
Neutrophils Relative %: 68 %
Platelets: 203 10*3/uL (ref 150–400)
RBC: 4.35 MIL/uL (ref 3.87–5.11)
RDW: 13.1 % (ref 11.5–15.5)
WBC: 6.8 10*3/uL (ref 4.0–10.5)
nRBC: 0 % (ref 0.0–0.2)

## 2019-10-30 MED ORDER — EPINEPHRINE 1 MG/10ML IJ SOSY
0.2500 mg | PREFILLED_SYRINGE | Freq: Once | INTRAMUSCULAR | Status: DC | PRN
  Filled 2019-10-30: qty 10

## 2019-10-30 MED ORDER — DIPHENHYDRAMINE HCL 50 MG/ML IJ SOLN: 25.0000 mg | Freq: Once | INTRAMUSCULAR | Status: DC | PRN

## 2019-10-30 MED ORDER — SODIUM CHLORIDE 0.9 % IV SOLN
Freq: Once | INTRAVENOUS | Status: DC
  Filled 2019-10-30: qty 250

## 2019-10-30 MED ORDER — DENOSUMAB 60 MG/ML ~~LOC~~ SOSY
60.0000 mg | PREFILLED_SYRINGE | Freq: Once | SUBCUTANEOUS | Status: AC
  Administered 2019-10-30: 60 mg via SUBCUTANEOUS
  Filled 2019-10-30: qty 1

## 2019-10-30 MED ORDER — ALBUTEROL SULFATE (2.5 MG/3ML) 0.083% IN NEBU
2.5000 mg | INHALATION_SOLUTION | Freq: Once | RESPIRATORY_TRACT | Status: DC | PRN
  Filled 2019-10-30: qty 3

## 2019-10-30 MED ORDER — DIPHENHYDRAMINE HCL 50 MG/ML IJ SOLN: 50.0000 mg | Freq: Once | INTRAMUSCULAR | Status: DC | PRN

## 2019-10-30 MED ORDER — SODIUM CHLORIDE 0.9 % IV SOLN
Freq: Once | INTRAVENOUS | Status: DC | PRN
  Filled 2019-10-30: qty 250

## 2019-10-30 MED ORDER — METHYLPREDNISOLONE SODIUM SUCC 125 MG IJ SOLR: 125.0000 mg | Freq: Once | INTRAMUSCULAR | Status: DC | PRN

## 2019-11-06 DIAGNOSIS — D485 Neoplasm of uncertain behavior of skin: Secondary | ICD-10-CM | POA: Diagnosis not present

## 2019-11-06 DIAGNOSIS — C44329 Squamous cell carcinoma of skin of other parts of face: Secondary | ICD-10-CM | POA: Diagnosis not present

## 2019-11-06 DIAGNOSIS — L570 Actinic keratosis: Secondary | ICD-10-CM | POA: Diagnosis not present

## 2019-11-06 DIAGNOSIS — L910 Hypertrophic scar: Secondary | ICD-10-CM | POA: Diagnosis not present

## 2019-11-27 ENCOUNTER — Encounter: Payer: Self-pay | Admitting: Internal Medicine

## 2019-12-04 DIAGNOSIS — C44329 Squamous cell carcinoma of skin of other parts of face: Secondary | ICD-10-CM | POA: Diagnosis not present

## 2020-02-07 ENCOUNTER — Other Ambulatory Visit: Payer: Self-pay

## 2020-02-07 ENCOUNTER — Ambulatory Visit (INDEPENDENT_AMBULATORY_CARE_PROVIDER_SITE_OTHER): Payer: Medicare PPO

## 2020-02-07 DIAGNOSIS — Z23 Encounter for immunization: Secondary | ICD-10-CM | POA: Diagnosis not present

## 2020-03-12 ENCOUNTER — Encounter: Payer: Medicare PPO | Admitting: Internal Medicine

## 2020-04-21 ENCOUNTER — Ambulatory Visit: Payer: Medicare PPO | Admitting: Internal Medicine

## 2020-04-23 ENCOUNTER — Other Ambulatory Visit: Payer: Self-pay | Admitting: Oncology

## 2020-04-23 ENCOUNTER — Other Ambulatory Visit: Payer: Self-pay | Admitting: Internal Medicine

## 2020-04-23 DIAGNOSIS — E782 Mixed hyperlipidemia: Secondary | ICD-10-CM

## 2020-04-23 DIAGNOSIS — F324 Major depressive disorder, single episode, in partial remission: Secondary | ICD-10-CM

## 2020-04-23 DIAGNOSIS — I10 Essential (primary) hypertension: Secondary | ICD-10-CM

## 2020-04-26 NOTE — Progress Notes (Signed)
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    INTERVAL HISTORY: Patient returns to clinic today for routine 37-monthevaluation and continuation of Prolia.  She is tolerating tamoxifen well without significant side effects.  She currently feels well and is asymptomatic. She has no neurologic complaints.  She denies any recent fevers or illnesses.  She has a good appetite and denies weight loss. She denies any chest pain, shortness of breath, cough, or hemoptysis.  She denies any nausea, vomiting, constipation, or diarrhea.  She has no urinary complaints.  Patient offers no specific complaints today.  REVIEW OF SYSTEMS:   Review of Systems  Constitutional: Negative.  Negative for fever, malaise/fatigue and weight loss.  Respiratory: Negative.  Negative for cough, hemoptysis and shortness of breath.   Cardiovascular: Negative.  Negative for chest pain and leg swelling.  Gastrointestinal: Negative.  Negative for abdominal pain.  Genitourinary: Negative.  Negative for dysuria.  Musculoskeletal: Negative.  Negative for back pain.  Skin: Negative.  Negative for rash.  Neurological: Negative.  Negative for dizziness, sensory change, focal weakness, weakness and headaches.  Psychiatric/Behavioral: Negative.  The patient is not nervous/anxious.     As per HPI. Otherwise, a complete review of systems is negative.  PAST MEDICAL HISTORY: Past Medical History:  Diagnosis Date  . [place]   [place]  . [place]   left breast cancer  . Depression   . Family history of breast cancer   .  Hyperlipidemia   . Hypertension     PAST SURGICAL HISTORY: Past Surgical History:  Procedure Laterality Date  . ABDOMINAL HYSTERECTOMY    . APPENDECTOMY    . BACK SURGERY    . BREAST BIOPSY Left 04/2018  . CATARACT EXTRACTION, BILATERAL    . CHOLECYSTECTOMY    . EYE SURGERY    . HYSTERECTOMY ABDOMINAL WITH SALPINGO-OOPHORECTOMY     benign reason  . INNER EAR SURGERY Bilateral   . LUMBAR DISC SURGERY    . MASTECTOMY W/ SENTINEL NODE BIOPSY Left 05/17/2018   Procedure: MASTECTOMY WITH SENTINEL LYMPH NODE BIOPSY;  Surgeon: PJules Husbands MD;  Location: [facility] ORS;  Service: General;  Laterality: Left;    FAMILY HISTORY: Family History  Problem Relation Age of Onset  . Breast cancer Mother 565 . CAD Father   . Breast cancer Maternal Aunt        dx 522s   ADVANCED DIRECTIVES (Y/N):  N  HEALTH MAINTENANCE: Social History   Tobacco Use  . Smoking status: Never Smoker  . Smokeless tobacco: Never Used  Vaping Use  . Vaping Use: Never used  Substance Use Topics  . Alcohol use: Never  . Drug use: Never     Colonoscopy:  PAP:  Bone density:  Lipid panel:  No Known Allergies  Current Outpatient Medications  Medication Sig Dispense Refill  . Biotin w/ Vitamins C & E (HAIR/SKIN/NAILS PO) Take 1 tablet by mouth daily.    .Marland Kitchendenosumab (PROLIA) 60 MG/ML SOSY injection Inject 60 mg into the skin every 6 (six) months.    .Marland Kitchenlosartan (COZAAR) 25 MG tablet TAKE 1 TABLET EVERY DAY 90 tablet  0  . meloxicam (MOBIC) 7.5 MG tablet TAKE 1 TABLET EVERY DAY 90 tablet 2  . Multiple Vitamins-Minerals (ICAPS AREDS 2 PO) Take 1 capsule by mouth 2 (two) times daily.     . Omega-3 Fatty Acids (FISH OIL PEARLS PO) Take 1,000 mg by mouth daily.     . pravastatin (PRAVACHOL) 40 MG tablet TAKE 1 TABLET EVERY DAY 90 tablet 0  . sertraline (ZOLOFT) 25 MG tablet TAKE 1 TABLET EVERY DAY 90 tablet 0  . sertraline (ZOLOFT) 50 MG tablet TAKE 1 TABLET EVERY DAY 90 tablet 0  . tamoxifen (NOLVADEX) 20 MG  tablet TAKE 1 TABLET EVERY DAY 90 tablet 0   No current facility-administered medications for this visit.    OBJECTIVE: Vitals:   04/30/20 1414  BP: 140/67  Pulse: 75  Temp: (!) 97.3 F (36.3 C)  SpO2: 100%     Body mass index is 21.03 kg/m.    ECOG FS:0 - Asymptomatic  General: Well-developed, well-nourished, no acute distress. Eyes: Pink conjunctiva, anicteric sclera. HEENT: Normocephalic, moist mucous membranes. Breasts: Exam deferred today. Lungs: No audible wheezing or coughing. Heart: Regular rate and rhythm. Abdomen: Soft, nontender, no obvious distention. Musculoskeletal: No edema, cyanosis, or clubbing. Neuro: Alert, answering all questions appropriately. Cranial nerves grossly intact. Skin: No rashes or petechiae noted. Psych: Normal affect.  LAB RESULTS:  Lab Results  Component Value Date   NA 140 04/30/2020   K 4.1 04/30/2020   CL 107 04/30/2020   CO2 23 04/30/2020   GLUCOSE 120 (H) 04/30/2020   BUN 22 04/30/2020   CREATININE 0.92 04/30/2020   CALCIUM 8.5 (L) 04/30/2020   PROT 5.9 (L) 04/30/2020   ALBUMIN 3.8 04/30/2020   AST 25 04/30/2020   ALT 12 04/30/2020   ALKPHOS 35 (L) 04/30/2020   BILITOT 0.7 04/30/2020   GFRNONAA 60 (L) 04/30/2020   GFRAA 60 (L) 10/30/2019    Lab Results  Component Value Date   WBC 7.0 04/30/2020   NEUTROABS 4.6 04/30/2020   HGB 13.4 04/30/2020   HCT 40.3 04/30/2020   MCV 91.0 04/30/2020   PLT 190 04/30/2020     STUDIES: No results found.  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  Because of this, she did not require adjuvant XRT.  Oncotype DX was 13 and therefore low risk therefore patient did not require adjuvant chemotherapy.  Letrozole was  discontinued secondary to osteoporosis and patient was recently initiated on tamoxifen.  Continue treatment for total of 5 years completing in January 2025.  Her most recent right sided screening mammogram on August 26, 2019 was reported as BI-RADS 1.  Repeat mammogram in April 2022.  Return to clinic in 6 months for routine evaluation. 2.  Osteoporosis: Patient's most recent bone mineral density on August 26, 2019 reported T score of -3.4 which is worse than previous.  Tamoxifen as above.  Proceed with Prolia today.  Continue calcium and vitamin D supplementation.  Patient will require repeat bone mineral density in April 2022 along with her mammogram.  Return to clinic in 6 months as above for further evaluation and continuation of treatment.   3.  Genetic testing: Per patient request, she was previously given a referral to defer genetic  counseling.   Patient expressed understanding and was in agreement with this plan. She also understands that She can call clinic at any time with any questions, concerns, or complaints.   Cancer Staging Breast cancer, left breast [facility]) Staging form: Breast, AJCC 8th Edition - Clinical stage from 04/22/2018: Stage IB (cT2, cN0, cM0, G2, ER+, PR+, HER2-) - Signed by Lloyd Huger, MD on 04/24/2018   Lloyd Huger, MD   05/01/2020 8:29 AM

## 2020-04-29 ENCOUNTER — Other Ambulatory Visit: Payer: Self-pay | Admitting: *Deleted

## 2020-04-29 DIAGNOSIS — Z170 Estrogen receptor positive status [ER+]: Secondary | ICD-10-CM

## 2020-04-30 ENCOUNTER — Inpatient Hospital Stay: Payer: Medicare PPO | Attending: Oncology

## 2020-04-30 ENCOUNTER — Inpatient Hospital Stay (HOSPITAL_BASED_OUTPATIENT_CLINIC_OR_DEPARTMENT_OTHER): Payer: Medicare PPO | Admitting: Oncology

## 2020-04-30 ENCOUNTER — Inpatient Hospital Stay: Payer: Medicare PPO

## 2020-04-30 ENCOUNTER — Other Ambulatory Visit: Payer: Self-pay

## 2020-04-30 VITALS — BP 140/67 | HR 75 | Temp 97.3°F | Wt 100.6 lb

## 2020-04-30 DIAGNOSIS — Z79810 Long term (current) use of selective estrogen receptor modulators (SERMs): Secondary | ICD-10-CM | POA: Insufficient documentation

## 2020-04-30 DIAGNOSIS — Z170 Estrogen receptor positive status [ER+]: Secondary | ICD-10-CM

## 2020-04-30 DIAGNOSIS — C50412 Malignant neoplasm of upper-outer quadrant of left female breast: Secondary | ICD-10-CM

## 2020-04-30 DIAGNOSIS — M810 Age-related osteoporosis without current pathological fracture: Secondary | ICD-10-CM | POA: Insufficient documentation

## 2020-04-30 DIAGNOSIS — Z79899 Other long term (current) drug therapy: Secondary | ICD-10-CM | POA: Diagnosis not present

## 2020-04-30 LAB — COMPREHENSIVE METABOLIC PANEL
ALT: 12 U/L (ref 0–44)
AST: 25 U/L (ref 15–41)
Albumin: 3.8 g/dL (ref 3.5–5.0)
Alkaline Phosphatase: 35 U/L — ABNORMAL LOW (ref 38–126)
Anion gap: 10 (ref 5–15)
BUN: 22 mg/dL (ref 8–23)
CO2: 23 mmol/L (ref 22–32)
Calcium: 8.5 mg/dL — ABNORMAL LOW (ref 8.9–10.3)
Chloride: 107 mmol/L (ref 98–111)
Creatinine, Ser: 0.92 mg/dL (ref 0.44–1.00)
GFR, Estimated: 60 mL/min — ABNORMAL LOW (ref >60)
Glucose, Bld: 120 mg/dL — ABNORMAL HIGH (ref 70–99)
Potassium: 4.1 mmol/L (ref 3.5–5.1)
Sodium: 140 mmol/L (ref 135–145)
Total Bilirubin: 0.7 mg/dL (ref 0.3–1.2)
Total Protein: 5.9 g/dL — ABNORMAL LOW (ref 6.5–8.1)

## 2020-04-30 LAB — CBC WITH DIFFERENTIAL/PLATELET
Abs Immature Granulocytes: 0.01 10*3/uL (ref 0.00–0.07)
Basophils Absolute: 0 10*3/uL (ref 0.0–0.1)
Basophils Relative: 1 %
Eosinophils Absolute: 0.3 10*3/uL (ref 0.0–0.5)
Eosinophils Relative: 4 %
HCT: 40.3 % (ref 36.0–46.0)
Hemoglobin: 13.4 g/dL (ref 12.0–15.0)
Immature Granulocytes: 0 %
Lymphocytes Relative: 22 %
Lymphs Abs: 1.6 10*3/uL (ref 0.7–4.0)
MCH: 30.2 pg (ref 26.0–34.0)
MCHC: 33.3 g/dL (ref 30.0–36.0)
MCV: 91 fL (ref 80.0–100.0)
Monocytes Absolute: 0.5 10*3/uL (ref 0.1–1.0)
Monocytes Relative: 7 %
Neutro Abs: 4.6 10*3/uL (ref 1.7–7.7)
Neutrophils Relative %: 66 %
Platelets: 190 10*3/uL (ref 150–400)
RBC: 4.43 MIL/uL (ref 3.87–5.11)
RDW: 14.1 % (ref 11.5–15.5)
WBC: 7 10*3/uL (ref 4.0–10.5)
nRBC: 0 % (ref 0.0–0.2)

## 2020-04-30 MED ORDER — DENOSUMAB 60 MG/ML ~~LOC~~ SOSY
60.0000 mg | PREFILLED_SYRINGE | Freq: Once | SUBCUTANEOUS | Status: AC
  Administered 2020-04-30: 15:00:00 60 mg via SUBCUTANEOUS
  Filled 2020-04-30: qty 1

## 2020-04-30 NOTE — Progress Notes (Signed)
Patient denies any concerns today.  

## 2020-05-12 ENCOUNTER — Telehealth: Payer: Self-pay | Admitting: Internal Medicine

## 2020-05-12 NOTE — Telephone Encounter (Signed)
Left message for patient to call back and schedule Medicare Annual Wellness Visit (AWV) either virtually or in office. Whichever the patients preference is.  No history of AWV; please schedule at anytime with [facility] Health Advisor.  This should be a 40 minute visit 

## 2020-05-18 ENCOUNTER — Encounter: Payer: Medicare PPO | Admitting: Internal Medicine

## 2020-05-19 ENCOUNTER — Encounter: Payer: Medicare PPO | Admitting: Internal Medicine

## 2020-08-02 ENCOUNTER — Other Ambulatory Visit: Payer: Self-pay | Admitting: Oncology

## 2020-08-02 ENCOUNTER — Other Ambulatory Visit: Payer: Self-pay | Admitting: Internal Medicine

## 2020-08-02 DIAGNOSIS — M1710 Unilateral primary osteoarthritis, unspecified knee: Secondary | ICD-10-CM

## 2020-08-02 DIAGNOSIS — E782 Mixed hyperlipidemia: Secondary | ICD-10-CM

## 2020-08-02 DIAGNOSIS — I10 Essential (primary) hypertension: Secondary | ICD-10-CM

## 2020-08-02 DIAGNOSIS — F324 Major depressive disorder, single episode, in partial remission: Secondary | ICD-10-CM

## 2020-08-02 NOTE — Telephone Encounter (Signed)
Requested medication (s) are due for refill today: yes  Requested medication (s) are on the active medication list: yes  Last refill:  04/23/20  Future visit scheduled: yes  Notes to clinic:  overdue lab work //last lipid panel 03/12/19   Requested Prescriptions  Pending Prescriptions Disp Refills   pravastatin (PRAVACHOL) 40 MG tablet [Pharmacy Med Name: PRAVASTATIN SODIUM 40 MG Tablet] 90 tablet     Sig: TAKE 1 TABLET EVERY DAY      Cardiovascular:  Antilipid - Statins Failed - 08/02/2020  4:28 PM      Failed - Total Cholesterol in normal range and within 360 days    Cholesterol, Total  Date Value Ref Range Status  03/12/2019 203 (H) 100 - 199 mg/dL Final          Failed - LDL in normal range and within 360 days    LDL Chol Calc (NIH)  Date Value Ref Range Status  03/12/2019 125 (H) 0 - 99 mg/dL Final          Failed - HDL in normal range and within 360 days    HDL  Date Value Ref Range Status  03/12/2019 58 >39 mg/dL Final          Failed - Triglycerides in normal range and within 360 days    Triglycerides  Date Value Ref Range Status  03/12/2019 113 0 - 149 mg/dL Final          Passed - Patient is not pregnant      Passed - Valid encounter within last 12 months    Recent Outpatient Visits           9 months ago Essential hypertension   [facility] Glean Hess, MD   1 year ago Annual physical exam   [facility] Glean Hess, MD   2 years ago Annual physical exam   [facility] Glean Hess, MD   2 years ago Essential hypertension   [place], Laura H, MD       Future Appointments             In 1 week Glean Hess, MD [facility]              Signed Prescriptions Disp Refills   meloxicam (MOBIC) 7.5 MG tablet 90 tablet 0    Sig: TAKE 1 TABLET EVERY DAY      Analgesics:  COX2 Inhibitors Passed - 08/02/2020  4:28 PM      Passed - HGB in normal range and  within 360 days    Hemoglobin  Date Value Ref Range Status  04/30/2020 13.4 12.0 - 15.0 g/dL Final  03/12/2019 13.7 11.1 - 15.9 g/dL Final          Passed - Cr in normal range and within 360 days    Creatinine, Ser  Date Value Ref Range Status  04/30/2020 0.92 0.44 - 1.00 mg/dL Final          Passed - Patient is not pregnant      Passed - Valid encounter within last 12 months    Recent Outpatient Visits           9 months ago Essential hypertension   [place], Laura H, MD   1 year ago Annual physical exam   [facility] Glean Hess, MD   2 years ago Annual physical exam   [facility] Glean Hess, MD   2 years ago Essential hypertension   [place], MD       Future Appointments             In 1 week Glean Hess, MD [facility]               sertraline (ZOLOFT) 50 MG tablet 90 tablet 0    Sig: TAKE 1 TABLET EVERY DAY      Psychiatry:  Antidepressants - SSRI Failed - 08/02/2020  4:28 PM      Failed - Valid encounter within last 6 months    Recent Outpatient Visits           9 months ago Essential hypertension   [facility] Glean Hess, MD   1 year ago Annual physical exam   [facility] Glean Hess, MD   2 years ago Annual physical exam   [facility] Glean Hess, MD   2 years ago Essential hypertension   [facility] Glean Hess, MD       Future Appointments             In 1 week Glean Hess, MD [facility], [place] - Completed PHQ-2 or PHQ-9 in the last 360 days        losartan (COZAAR) 25 MG tablet 90 tablet 0    Sig: TAKE 1 TABLET EVERY DAY      Cardiovascular:  Angiotensin Receptor Blockers Failed - 08/02/2020  4:28 PM      Failed - Last BP in normal range    BP Readings from Last 1 Encounters:  04/30/20 140/67          Failed - Valid  encounter within last 6 months    Recent Outpatient Visits           9 months ago Essential hypertension   [facility] Glean Hess, MD   1 year ago Annual physical exam   [facility] Gle[facility], [facility]   2 years ago Annual physical exam   [facility] Glean Hess, MD   2 years ago Essential hypertension   [facility] Glean Hess, MD       Future Appointments             In 1 week Glean Hess, MD [facility], [place] in normal range and within 180 days    Creatinine, Ser  Date Value Ref Range Status  04/30/2020 0.92 0.44 - 1.00 mg/dL Final          Passed - K in normal range and within 180 days    Potassium  Date Value Ref Range Status  04/30/2020 4.1 3.5 - 5.1 mmol/L Final          Passed - Patient is not pregnant        sertraline (ZOLOFT) 25 MG tablet 90 tablet 0    Sig: TAKE 1 TABLET EVERY DAY      Psychiatry:  Antidepressants - SSRI Failed - 08/02/2020  4:28 PM      Failed - Valid encounter within last 6 months    Recent Outpatient Visits  9 months ago Essential hypertension   [facility] Glean Hess, MD   1 year ago Annual physical exam   [facility] Glean Hess, MD   2 years ago Annual physical exam   [facility] Glean Hess, MD   2 years ago Essential hypertension   [facility] Glean Hess, MD       Future Appointments             In 1 week [place] Melia Jesse Sans, MD [place] - Completed PHQ-2 or PHQ-9 in the last 360 days

## 2020-08-02 NOTE — Telephone Encounter (Signed)
Requested Prescriptions  Pending Prescriptions Disp Refills  . pravastatin (PRAVACHOL) 40 MG tablet [Pharmacy Med Name: PRAVASTATIN SODIUM 40 MG Tablet] 90 tablet     Sig: TAKE 1 TABLET EVERY DAY     Cardiovascular:  Antilipid - Statins Failed - 08/02/2020  4:28 PM      Failed - Total Cholesterol in normal range and within 360 days    Cholesterol, Total  Date Value Ref Range Status  03/12/2019 203 (H) 100 - 199 mg/dL Final         Failed - LDL in normal range and within 360 days    LDL Chol Calc (NIH)  Date Value Ref Range Status  03/12/2019 125 (H) 0 - 99 mg/dL Final         Failed - HDL in normal range and within 360 days    HDL  Date Value Ref Range Status  03/12/2019 58 >39 mg/dL Final         Failed - Triglycerides in normal range and within 360 days    Triglycerides  Date Value Ref Range Status  03/12/2019 113 0 - 149 mg/dL Final         Passed - Patient is not pregnant      Passed - Valid encounter within last 12 months    Recent Outpatient Visits          9 months ago Essential hypertension   [facility] Glean Hess, MD   1 year ago Annual physical exam   [facility] Glean Hess, MD   2 years ago Annual physical exam   [facility] Glean Hess, MD   2 years ago Essential hypertension   [facility] Glean Hess, MD      Future Appointments            In 1 week Glean Hess, MD [facility]           . meloxicam ([place]) 7.5 MG tablet [Pharmacy Med Name: MELOXICAM 7.5 MG Tablet] 90 tablet 0    Sig: TAKE 1 TABLET EVERY DAY     Analgesics:  COX2 Inhibitors Passed - 08/02/2020  4:28 PM      Passed - HGB in normal range and within 360 days    Hemoglobin  Date Value Ref Range Status  04/30/2020 13.4 12.0 - 15.0 g/dL Final  03/12/2019 13.7 11.1 - 15.9 g/dL Final         Passed - Cr in normal range and within 360 days    Creatinine, Ser  Date Value Ref Range Status   04/30/2020 0.92 0.44 - 1.00 mg/dL Final         Passed - Patient is not pregnant      Passed - Valid encounter within last 12 months    Recent Outpatient Visits          9 months ago Essential hypertension   [place], Laura H, MD   1 year ago Annual physical exam   [facility] Glean Hess, MD   2 years ago Annual physical exam   [facility] Glean Hess, MD   2 years ago Essential hypertension   [facility] Glean Hess, MD      Future Appointments            In 1 week Army Melia Jesse Sans, MD [facility], [place]           .  sertraline (ZOLOFT) 50 MG tablet [Pharmacy Med Name: SERTRALINE HCL 50 MG Tablet] 90 tablet 0    Sig: TAKE 1 TABLET EVERY DAY     Psychiatry:  Antidepressants - SSRI Failed - 08/02/2020  4:28 PM      Failed - Valid encounter within last 6 months    Recent Outpatient Visits          9 months ago Essential hypertension   [facility] Glean Hess, MD   1 year ago Annual physical exam   [facility] Glean Hess, MD   2 years ago Annual physical exam   [facility] Glean Hess, MD   2 years ago Essential hypertension   [place], MD      Future Appointments            In 1 week Army Melia Jesse Sans, MD [place] - Completed PHQ-2 or PHQ-9 in the last 360 days      . losartan (COZAAR) 25 MG tablet [Pharmacy Med Name: LOSARTAN POTASSIUM 25 MG Tablet] 90 tablet 0    Sig: TAKE 1 TABLET EVERY DAY     Cardiovascular:  Angiotensin Receptor Blockers Failed - 08/02/2020  4:28 PM      Failed - Last BP in normal range    BP Readings from Last 1 Encounters:  04/30/20 140/67         Failed - Valid encounter within last 6 months    Recent Outpatient Visits          9 months ago Essential hypertension   [facility] Glean Hess, MD   1 year ago Annual physical  exam   [facility] Glean Hess, MD   2 years ago Annual physical exam   [facility] Glean Hess, MD   2 years ago Essential hypertension   [facility] Glean Hess, MD      Future Appointments            In 1 week Glean Hess, MD [facility], [place] in normal range and within 180 days    Creatinine, Ser  Date Value Ref Range Status  04/30/2020 0.92 0.44 - 1.00 mg/dL Final         Passed - K in normal range and within 180 days    Potassium  Date Value Ref Range Status  04/30/2020 4.1 3.5 - 5.1 mmol/L Final         Passed - Patient is not pregnant      . sertraline (ZOLOFT) 25 MG tablet [Pharmacy Med Name: SERTRALINE HCL 25 MG Tablet] 90 tablet 0    Sig: TAKE 1 TABLET EVERY DAY     Psychiatry:  Antidepressants - SSRI Failed - 08/02/2020  4:28 PM      Failed - Valid encounter within last 6 months    Recent Outpatient Visits          9 months ago Essential hypertension   [facility] Glean Hess, MD   1 year ago Annual physical exam   [facility] Glean Hess, MD   2 years ago Annual physical exam   [facility] Glean Hess, MD   2 years ago Essential hypertension   [facility]  Glean Hess, MD      Future Appointments            In 1 week Army Melia Jesse Sans, MD [place] - Completed PHQ-2 or PHQ-9 in the last 360 days

## 2020-08-10 DIAGNOSIS — H353130 Nonexudative age-related macular degeneration, bilateral, stage unspecified: Secondary | ICD-10-CM | POA: Diagnosis not present

## 2020-08-10 DIAGNOSIS — H35033 Hypertensive retinopathy, bilateral: Secondary | ICD-10-CM | POA: Diagnosis not present

## 2020-08-10 DIAGNOSIS — Z961 Presence of intraocular lens: Secondary | ICD-10-CM | POA: Diagnosis not present

## 2020-08-11 ENCOUNTER — Ambulatory Visit (INDEPENDENT_AMBULATORY_CARE_PROVIDER_SITE_OTHER): Payer: Medicare PPO | Admitting: Internal Medicine

## 2020-08-11 ENCOUNTER — Other Ambulatory Visit: Payer: Self-pay

## 2020-08-11 ENCOUNTER — Encounter: Payer: Self-pay | Admitting: Internal Medicine

## 2020-08-11 ENCOUNTER — Other Ambulatory Visit: Payer: Self-pay | Admitting: Internal Medicine

## 2020-08-11 VITALS — BP 96/70 | HR 60 | Ht <= 58 in | Wt 103.0 lb

## 2020-08-11 DIAGNOSIS — I10 Essential (primary) hypertension: Secondary | ICD-10-CM | POA: Diagnosis not present

## 2020-08-11 DIAGNOSIS — C50412 Malignant neoplasm of upper-outer quadrant of left female breast: Secondary | ICD-10-CM | POA: Diagnosis not present

## 2020-08-11 DIAGNOSIS — N6315 Unspecified lump in the right breast, overlapping quadrants: Secondary | ICD-10-CM

## 2020-08-11 DIAGNOSIS — F324 Major depressive disorder, single episode, in partial remission: Secondary | ICD-10-CM

## 2020-08-11 DIAGNOSIS — Z0000 Encounter for general adult medical examination without abnormal findings: Secondary | ICD-10-CM | POA: Diagnosis not present

## 2020-08-11 DIAGNOSIS — Z170 Estrogen receptor positive status [ER+]: Secondary | ICD-10-CM

## 2020-08-11 DIAGNOSIS — E782 Mixed hyperlipidemia: Secondary | ICD-10-CM | POA: Diagnosis not present

## 2020-08-11 DIAGNOSIS — M810 Age-related osteoporosis without current pathological fracture: Secondary | ICD-10-CM | POA: Diagnosis not present

## 2020-08-11 DIAGNOSIS — R8271 Bacteriuria: Secondary | ICD-10-CM

## 2020-08-11 LAB — POCT URINALYSIS DIPSTICK
Bilirubin, UA: NEGATIVE
Glucose, UA: NEGATIVE
Ketones, UA: 40
Nitrite, UA: NEGATIVE
Protein, UA: POSITIVE — AB
Spec Grav, UA: 1.01 (ref 1.010–1.025)
Urobilinogen, UA: 0.2 E.U./dL
pH, UA: 5 (ref 5.0–8.0)

## 2020-08-11 NOTE — Progress Notes (Signed)
Date:  08/11/2020   Name:  Christina Mccarthy   DOB:  1930/01/25   MRN:  [id number]   Chief Complaint: Annual Exam (Double mastectomy. No breast exam.)  Christina Mccarthy is a 87 y.o. female who presents today for her Complete Annual Exam. She feels well. She reports exercising none. She reports she is sleeping well. Breast complaints - followed by Oncology but states she noticed a lump on the right several months ago.  Mammogram: 08/2019 annual scheduled DEXA: 08/2019 osteoporosis; on Prolia from Oncology Colonoscopy: aged out  Immunization History  Administered Date(s) Administered  . Fluad Quad(high Dose 65+) 01/02/2019, 02/07/2020  . Influenza, High Dose Seasonal PF 02/21/2018  . PFIZER(Purple Top)SARS-COV-2 Vaccination 06/16/2019, 07/16/2019, 02/26/2020  . Pneumococcal Polysaccharide-23 02/13/2013    Hypertension This is a chronic problem. The problem is controlled. Pertinent negatives include no chest pain, headaches, palpitations or shortness of breath. Past treatments include angiotensin blockers. The current treatment provides significant improvement. There are no compliance problems.   Hyperlipidemia The problem is controlled. Pertinent negatives include no chest pain or shortness of breath. Current antihyperlipidemic treatment includes statins. The current treatment provides significant improvement of lipids. There are no compliance problems.   Depression        This is a chronic problem.The problem is unchanged.  Associated symptoms include no fatigue and no headaches.  Past treatments include SSRIs - Selective serotonin reuptake inhibitors.   Lab Results  Component Value Date   CREATININE 0.92 04/30/2020   BUN 22 04/30/2020   NA 140 04/30/2020   K 4.1 04/30/2020   CL 107 04/30/2020   CO2 23 04/30/2020   Lab Results  Component Value Date   CHOL 203 (H) 03/12/2019   HDL 58 03/12/2019   LDLCALC 125 (H) 03/12/2019   TRIG 113 03/12/2019   CHOLHDL 3.5 03/12/2019   Lab Results   Component Value Date   TSH 4.950 (H) 03/12/2019   No results found for: HGBA1C Lab Results  Component Value Date   WBC 7.0 04/30/2020   HGB 13.4 04/30/2020   HCT 40.3 04/30/2020   MCV 91.0 04/30/2020   PLT 190 04/30/2020   Lab Results  Component Value Date   ALT 12 04/30/2020   AST 25 04/30/2020   ALKPHOS 35 (L) 04/30/2020   BILITOT 0.7 04/30/2020     Review of Systems  Constitutional: Negative for chills, fatigue and fever.  HENT: Negative for congestion, hearing loss, tinnitus, trouble swallowing and voice change.   Eyes: Negative for visual disturbance.  Respiratory: Negative for cough, chest tightness, shortness of breath and wheezing.   Cardiovascular: Negative for chest pain, palpitations and leg swelling.  Gastrointestinal: Negative for abdominal pain, constipation, diarrhea and vomiting.  Endocrine: Negative for polydipsia and polyuria.  Genitourinary: Negative for dysuria, frequency, genital sores, vaginal bleeding and vaginal discharge.       Right breast lump  Musculoskeletal: Negative for arthralgias, gait problem and joint swelling.  Skin: Negative for color change and rash.  Neurological: Negative for dizziness, tremors, light-headedness and headaches.  Hematological: Negative for adenopathy. Does not bruise/bleed easily.  Psychiatric/Behavioral: Positive for depression. Negative for dysphoric mood and sleep disturbance. The patient is not nervous/anxious.     Patient Active Problem List   Diagnosis Date Noted  . Osteoporosis 09/27/2018  . Breast cancer, left breast ([place]) 03/09/2018  . Mixed hyperlipidemia 09/04/2017  . Essential hypertension 09/04/2017  . Primary osteoarthritis of knee 09/04/2017  . Major depressive disorder with single episode, in partial  remission ([place]) 09/04/2017    No Known Allergies  Past Surgical History:  Procedure Laterality Date  . ABDOMINAL HYSTERECTOMY    . APPENDECTOMY    . BACK SURGERY    . BREAST BIOPSY Left  04/2018  . CATARACT EXTRACTION, BILATERAL    . CHOLECYSTECTOMY    . EYE SURGERY    . HYSTERECTOMY ABDOMINAL WITH SALPINGO-OOPHORECTOMY     benign reason  . INNER EAR SURGERY Bilateral   . LUMBAR DISC SURGERY    . MASTECTOMY W/ SENTINEL NODE BIOPSY Left 05/17/2018   Procedure: MASTECTOMY WITH SENTINEL LYMPH NODE BIOPSY;  Surgeon: Jules Husbands, MD;  Location: [facility] ORS;  Service: General;  Laterality: Left;    Social History   Tobacco Use  . Smoking status: Never Smoker  . Smokeless tobacco: Never Used  Vaping Use  . Vaping Use: Never used  Substance Use Topics  . Alcohol use: Never  . Drug use: Never     Medication list has been reviewed and updated.  Current Meds  Medication Sig  . Biotin w/ Vitamins C & E (HAIR/SKIN/NAILS PO) Take 1 tablet by mouth daily.  Marland Kitchen denosumab (PROLIA) 60 MG/ML SOSY injection Inject 60 mg into the skin every 6 (six) months.  Marland Kitchen losartan (COZAAR) 25 MG tablet TAKE 1 TABLET EVERY DAY  . meloxicam (MOBIC) 7.5 MG tablet TAKE 1 TABLET EVERY DAY  . Multiple Vitamins-Minerals (ICAPS AREDS 2 PO) Take 1 capsule by mouth 2 (two) times daily.   . Omega-3 Fatty Acids (FISH OIL PEARLS PO) Take 1,000 mg by mouth daily.   . pravastatin (PRAVACHOL) 40 MG tablet TAKE 1 TABLET EVERY DAY  . sertraline (ZOLOFT) 25 MG tablet TAKE 1 TABLET EVERY DAY  . sertraline (ZOLOFT) 50 MG tablet TAKE 1 TABLET EVERY DAY  . tamoxifen (NOLVADEX) 20 MG tablet TAKE 1 TABLET EVERY DAY    PHQ 2/9 Scores 08/11/2020 10/08/2019 03/12/2019 03/09/2018  PHQ - 2 Score 0 0 0 0  PHQ- 9 Score 2 2 3  0    GAD 7 : Generalized Anxiety Score 08/11/2020 10/08/2019  Nervous, Anxious, on Edge 0 0  Control/stop worrying 0 0  Worry too much - different things 0 0  Trouble relaxing 0 0  Restless 0 0  Easily annoyed or irritable 0 0  Afraid - awful might happen 0 0  Total GAD 7 Score 0 0  Anxiety Difficulty Not difficult at all Not difficult at all    BP Readings from Last 3 Encounters:  08/11/20  96/70  04/30/20 140/67  10/30/19 140/67    Physical Exam Vitals and nursing note reviewed.  Constitutional:      General: She is not in acute distress.    Appearance: She is well-developed.  HENT:     Head: Normocephalic and atraumatic.     Right Ear: Tympanic membrane and ear canal normal.     Left Ear: Tympanic membrane and ear canal normal.     Nose:     Right Sinus: No maxillary sinus tenderness.     Left Sinus: No maxillary sinus tenderness.  Eyes:     General: No scleral icterus.       Right eye: No discharge.        Left eye: No discharge.     Conjunctiva/sclera: Conjunctivae normal.  Neck:     Thyroid: No thyromegaly.     Vascular: No carotid bruit.  Cardiovascular:     Rate and Rhythm: Normal rate and regular rhythm.  Pulses: Normal pulses.     Heart sounds: Normal heart sounds.  Pulmonary:     Effort: Pulmonary effort is normal. No respiratory distress.     Breath sounds: No wheezing.  Chest:  Breasts:     Right: Mass (1 cm hard mobile mass) present. No nipple discharge, skin change or tenderness.     Left: Absent.     Abdominal:     General: Bowel sounds are normal.     Palpations: Abdomen is soft.     Tenderness: There is no abdominal tenderness.  Musculoskeletal:     Cervical back: Normal range of motion. No erythema.     Right lower leg: No edema.     Left lower leg: No edema.  Lymphadenopathy:     Cervical: No cervical adenopathy.  Skin:    General: Skin is warm and dry.     Findings: No rash.  Neurological:     Mental Status: She is alert and oriented to person, place, and time.     Cranial Nerves: No cranial nerve deficit.     Sensory: No sensory deficit.     Deep Tendon Reflexes: Reflexes are normal and symmetric.  Psychiatric:        Attention and Perception: Attention normal.        Mood and Affect: Mood normal.     Wt Readings from Last 3 Encounters:  08/11/20 103 lb (46.7 kg)  04/30/20 100 lb 9.6 oz (45.6 kg)  10/30/19 105 lb  14.4 oz (48 kg)    BP 96/70   Pulse 60   Ht 4\' 10"  (1.473 m)   Wt 103 lb (46.7 kg)   BMI 21.53 kg/m   Assessment and Plan: 1. Annual physical exam Normal exam for age Continue dietary supplements and healthy meals  2. Essential hypertension Clinically stable exam with well controlled BP. Tolerating medications without side effects at this time. Pt to continue current regimen and low sodium diet; benefits of regular exercise as able discussed. - CBC with Differential/Platelet - Comprehensive metabolic panel - POCT urinalysis dipstick  3. Mixed hyperlipidemia Tolerating Pravastatin - Lipid panel  4. Major depressive disorder with single episode, in partial remission (HCC) Clinically stable on current regimen with good control of symptoms, No SI or HI. Will continue current therapy. - TSH  5. Osteoporosis without current pathological fracture, unspecified osteoporosis type On Prolia from Oncology  6. Malignant neoplasm of upper-outer quadrant of left breast in female, estrogen receptor positive ([place] [place]) S/p left mastectomy  7. Breast lump on right side at 9 o'clock position New lump on right - will cancel screening mammogram and order right Dx with [place] - MM DIAG BREAST TOMO UNI RIGHT; Future - US BREAST LTD UNI RIGHT INC AXILLA; Future  8. Bacteria in urine Will get Culture and treat if appropriate - Urine Culture   Partially dictated using Dragon software. Any errors are unintentional.  Halina Maidens, MD [facility]  08/11/2020

## 2020-08-12 DIAGNOSIS — L821 Other seborrheic keratosis: Secondary | ICD-10-CM | POA: Diagnosis not present

## 2020-08-12 DIAGNOSIS — L570 Actinic keratosis: Secondary | ICD-10-CM | POA: Diagnosis not present

## 2020-08-12 DIAGNOSIS — D225 Melanocytic nevi of trunk: Secondary | ICD-10-CM | POA: Diagnosis not present

## 2020-08-12 DIAGNOSIS — Z859 Personal history of malignant neoplasm, unspecified: Secondary | ICD-10-CM | POA: Diagnosis not present

## 2020-08-12 DIAGNOSIS — L578 Other skin changes due to chronic exposure to nonionizing radiation: Secondary | ICD-10-CM | POA: Diagnosis not present

## 2020-08-12 LAB — CBC WITH DIFFERENTIAL/PLATELET
Basophils Absolute: 0.1 10*3/uL (ref 0.0–0.2)
Basos: 1 %
EOS (ABSOLUTE): 0.3 10*3/uL (ref 0.0–0.4)
Eos: 4 %
Hematocrit: 39.2 % (ref 34.0–46.6)
Hemoglobin: 12.7 g/dL (ref 11.1–15.9)
Immature Grans (Abs): 0 10*3/uL (ref 0.0–0.1)
Immature Granulocytes: 0 %
Lymphocytes Absolute: 1.2 10*3/uL (ref 0.7–3.1)
Lymphs: 18 %
MCH: 29.7 pg (ref 26.6–33.0)
MCHC: 32.4 g/dL (ref 31.5–35.7)
MCV: 92 fL (ref 79–97)
Monocytes Absolute: 0.4 10*3/uL (ref 0.1–0.9)
Monocytes: 6 %
Neutrophils Absolute: 5 10*3/uL (ref 1.4–7.0)
Neutrophils: 71 %
Platelets: 207 10*3/uL (ref 150–450)
RBC: 4.28 x10E6/uL (ref 3.77–5.28)
RDW: 12.7 % (ref 11.7–15.4)
WBC: 7 10*3/uL (ref 3.4–10.8)

## 2020-08-12 LAB — COMPREHENSIVE METABOLIC PANEL
ALT: 7 IU/L (ref 0–32)
AST: 22 IU/L (ref 0–40)
Albumin/Globulin Ratio: 2.2 (ref 1.2–2.2)
Albumin: 4.3 g/dL (ref 3.6–4.6)
Alkaline Phosphatase: 51 IU/L (ref 44–121)
BUN/Creatinine Ratio: 22 (ref 12–28)
BUN: 22 mg/dL (ref 8–27)
Bilirubin Total: 0.4 mg/dL (ref 0.0–1.2)
CO2: 22 mmol/L (ref 20–29)
Calcium: 9.4 mg/dL (ref 8.7–10.3)
Chloride: 104 mmol/L (ref 96–106)
Creatinine, Ser: 1.01 mg/dL — ABNORMAL HIGH (ref 0.57–1.00)
Globulin, Total: 2 g/dL (ref 1.5–4.5)
Glucose: 92 mg/dL (ref 65–99)
Potassium: 5 mmol/L (ref 3.5–5.2)
Sodium: 144 mmol/L (ref 134–144)
Total Protein: 6.3 g/dL (ref 6.0–8.5)
eGFR: 53 mL/min/{1.73_m2} — ABNORMAL LOW (ref >59)

## 2020-08-12 LAB — LIPID PANEL
Chol/HDL Ratio: 2.8 ratio (ref 0.0–4.4)
Cholesterol, Total: 164 mg/dL (ref 100–199)
HDL: 59 mg/dL (ref >39)
LDL Chol Calc (NIH): 86 mg/dL (ref 0–99)
Triglycerides: 107 mg/dL (ref 0–149)
VLDL Cholesterol Cal: 19 mg/dL (ref 5–40)

## 2020-08-12 LAB — TSH: TSH: 3.85 u[IU]/mL (ref 0.450–4.500)

## 2020-08-14 ENCOUNTER — Ambulatory Visit
Admission: RE | Admit: 2020-08-14 | Discharge: 2020-08-14 | Disposition: A | Discharge status: Home / Self Care | Payer: Medicare PPO | Source: Ambulatory Visit | Attending: Internal Medicine | Admitting: Internal Medicine

## 2020-08-14 ENCOUNTER — Other Ambulatory Visit: Payer: Self-pay

## 2020-08-14 DIAGNOSIS — N6315 Unspecified lump in the right breast, overlapping quadrants: Secondary | ICD-10-CM | POA: Insufficient documentation

## 2020-08-14 DIAGNOSIS — R922 Inconclusive mammogram: Secondary | ICD-10-CM | POA: Diagnosis not present

## 2020-08-14 DIAGNOSIS — Z853 Personal history of malignant neoplasm of breast: Secondary | ICD-10-CM | POA: Diagnosis not present

## 2020-08-17 ENCOUNTER — Other Ambulatory Visit: Payer: Self-pay | Admitting: Internal Medicine

## 2020-08-17 ENCOUNTER — Telehealth: Payer: Self-pay

## 2020-08-17 DIAGNOSIS — N3000 Acute cystitis without hematuria: Secondary | ICD-10-CM

## 2020-08-17 LAB — URINE CULTURE

## 2020-08-17 MED ORDER — CEFUROXIME AXETIL 250 MG PO TABS: 250.0000 mg | ORAL_TABLET | Freq: Two times a day (BID) | ORAL | 0 refills | Status: AC

## 2020-08-17 NOTE — Telephone Encounter (Signed)
Called pts daughter. Left VM to call back.   When she calls back let her know that her mothers urine culture tested positive for Ecoli. An antibiotic was sent to [facility].  Will route this note to [facility] Nurse just in case the patient returns call to clinic. [facility] Nurse may give patient results if pt returns the call.  CRM has also been created for this msg for call center purposes. Please attach any notes regarding this lab to this result message and do not create a new CRM.   KP

## 2020-08-17 NOTE — Telephone Encounter (Signed)
Pt's daughter called for her mom's UA culture results. Please return daughter's call.

## 2020-08-17 NOTE — Telephone Encounter (Signed)
Patient's daughter Arrie Aran called, on DPR, left VM to return the call to the office for results.

## 2020-08-18 NOTE — Telephone Encounter (Signed)
Patient's daughter Arrie Aran called, on DPR, left VM to return the call to the office for results.

## 2020-08-26 ENCOUNTER — Other Ambulatory Visit: Payer: Medicare PPO

## 2020-09-08 ENCOUNTER — Telehealth: Payer: Self-pay | Admitting: Internal Medicine

## 2020-09-08 ENCOUNTER — Inpatient Hospital Stay: Admission: RE | Admit: 2020-09-08 | Payer: Medicare PPO | Source: Ambulatory Visit

## 2020-09-08 NOTE — Telephone Encounter (Signed)
Copied from [place] [id number]. Topic: Medicare AWV >> Sep 08, 2020 10:57 AM Cher Nakai R wrote: Reason for CRM:   Left message for patient to call back and schedule Medicare Annual Wellness Visit (AWV) in office.   If unable to come into the office for AWV,  please offer to do virtually or by telephone.  No hx of AWV eligible as of 06/02/2009 awvi  Please schedule at anytime with [facility] Health Advisor.      40 Minutes appointment   Any questions, please call me at [phone]

## 2020-09-14 ENCOUNTER — Inpatient Hospital Stay: Admission: RE | Admit: 2020-09-14 | Payer: Medicare PPO | Source: Ambulatory Visit

## 2020-10-20 ENCOUNTER — Other Ambulatory Visit: Payer: Self-pay

## 2020-10-20 ENCOUNTER — Ambulatory Visit
Admission: RE | Admit: 2020-10-20 | Discharge: 2020-10-20 | Disposition: A | Discharge status: Home / Self Care | Payer: Medicare PPO | Source: Ambulatory Visit | Attending: Oncology | Admitting: Oncology

## 2020-10-20 DIAGNOSIS — Z7983 Long term (current) use of bisphosphonates: Secondary | ICD-10-CM | POA: Diagnosis not present

## 2020-10-20 DIAGNOSIS — C50412 Malignant neoplasm of upper-outer quadrant of left female breast: Secondary | ICD-10-CM | POA: Diagnosis not present

## 2020-10-20 DIAGNOSIS — M810 Age-related osteoporosis without current pathological fracture: Secondary | ICD-10-CM | POA: Diagnosis not present

## 2020-10-20 DIAGNOSIS — Z170 Estrogen receptor positive status [ER+]: Secondary | ICD-10-CM | POA: Insufficient documentation

## 2020-10-20 DIAGNOSIS — Z780 Asymptomatic menopausal state: Secondary | ICD-10-CM | POA: Insufficient documentation

## 2020-10-27 ENCOUNTER — Telehealth: Payer: Self-pay | Admitting: Internal Medicine

## 2020-10-27 NOTE — Telephone Encounter (Signed)
Copied from [place] [id number]. Topic: Medicare AWV >> Oct 27, 2020  2:07 PM Cher Nakai R wrote: Reason for CRM:  Left message for patient to call back and schedule Medicare Annual Wellness Visit (AWV) in office.   If unable to come into the office for AWV,  please offer to do virtually or by telephone.  No hx of AWV eligible for AWVI as of 05/02/2009  Please schedule at anytime with [facility] Health Advisor.      40 Minutes appointment   Any questions, please call me at [phone]

## 2020-11-04 ENCOUNTER — Other Ambulatory Visit: Payer: Self-pay | Admitting: *Deleted

## 2020-11-04 DIAGNOSIS — M810 Age-related osteoporosis without current pathological fracture: Secondary | ICD-10-CM

## 2020-11-06 ENCOUNTER — Inpatient Hospital Stay (HOSPITAL_BASED_OUTPATIENT_CLINIC_OR_DEPARTMENT_OTHER): Payer: Medicare PPO | Admitting: Oncology

## 2020-11-06 ENCOUNTER — Inpatient Hospital Stay: Payer: Medicare PPO | Attending: Oncology

## 2020-11-06 ENCOUNTER — Other Ambulatory Visit: Payer: Self-pay

## 2020-11-06 ENCOUNTER — Inpatient Hospital Stay: Payer: Medicare PPO

## 2020-11-06 VITALS — BP 117/67 | HR 55 | Temp 98.4°F | Resp 16 | Wt 100.2 lb

## 2020-11-06 DIAGNOSIS — C50412 Malignant neoplasm of upper-outer quadrant of left female breast: Secondary | ICD-10-CM | POA: Insufficient documentation

## 2020-11-06 DIAGNOSIS — Z79810 Long term (current) use of selective estrogen receptor modulators (SERMs): Secondary | ICD-10-CM | POA: Insufficient documentation

## 2020-11-06 DIAGNOSIS — Z170 Estrogen receptor positive status [ER+]: Secondary | ICD-10-CM | POA: Diagnosis not present

## 2020-11-06 DIAGNOSIS — M810 Age-related osteoporosis without current pathological fracture: Secondary | ICD-10-CM

## 2020-11-06 DIAGNOSIS — Z90710 Acquired absence of both cervix and uterus: Secondary | ICD-10-CM | POA: Diagnosis not present

## 2020-11-06 DIAGNOSIS — Z90721 Acquired absence of ovaries, unilateral: Secondary | ICD-10-CM | POA: Insufficient documentation

## 2020-11-06 DIAGNOSIS — Z79899 Other long term (current) drug therapy: Secondary | ICD-10-CM | POA: Diagnosis not present

## 2020-11-06 LAB — CBC WITH DIFFERENTIAL/PLATELET
Abs Immature Granulocytes: 0.02 10*3/uL (ref 0.00–0.07)
Basophils Absolute: 0.1 10*3/uL (ref 0.0–0.1)
Basophils Relative: 1 %
Eosinophils Absolute: 0.4 10*3/uL (ref 0.0–0.5)
Eosinophils Relative: 6 %
HCT: 38.9 % (ref 36.0–46.0)
Hemoglobin: 12.5 g/dL (ref 12.0–15.0)
Immature Granulocytes: 0 %
Lymphocytes Relative: 24 %
Lymphs Abs: 1.6 10*3/uL (ref 0.7–4.0)
MCH: 30.3 pg (ref 26.0–34.0)
MCHC: 32.1 g/dL (ref 30.0–36.0)
MCV: 94.4 fL (ref 80.0–100.0)
Monocytes Absolute: 0.5 10*3/uL (ref 0.1–1.0)
Monocytes Relative: 8 %
Neutro Abs: 4.2 10*3/uL (ref 1.7–7.7)
Neutrophils Relative %: 61 %
Platelets: 176 10*3/uL (ref 150–400)
RBC: 4.12 MIL/uL (ref 3.87–5.11)
RDW: 13.3 % (ref 11.5–15.5)
WBC: 6.8 10*3/uL (ref 4.0–10.5)
nRBC: 0 % (ref 0.0–0.2)

## 2020-11-06 LAB — COMPREHENSIVE METABOLIC PANEL
ALT: 11 U/L (ref 0–44)
AST: 29 U/L (ref 15–41)
Albumin: 3.8 g/dL (ref 3.5–5.0)
Alkaline Phosphatase: 43 U/L (ref 38–126)
Anion gap: 8 (ref 5–15)
BUN: 25 mg/dL — ABNORMAL HIGH (ref 8–23)
CO2: 26 mmol/L (ref 22–32)
Calcium: 8.9 mg/dL (ref 8.9–10.3)
Chloride: 106 mmol/L (ref 98–111)
Creatinine, Ser: 1.04 mg/dL — ABNORMAL HIGH (ref 0.44–1.00)
GFR, Estimated: 51 mL/min — ABNORMAL LOW (ref >60)
Glucose, Bld: 90 mg/dL (ref 70–99)
Potassium: 4.1 mmol/L (ref 3.5–5.1)
Sodium: 140 mmol/L (ref 135–145)
Total Bilirubin: 0.7 mg/dL (ref 0.3–1.2)
Total Protein: 5.7 g/dL — ABNORMAL LOW (ref 6.5–8.1)

## 2020-11-06 MED ORDER — DENOSUMAB 60 MG/ML ~~LOC~~ SOSY
60.0000 mg | PREFILLED_SYRINGE | Freq: Once | SUBCUTANEOUS | Status: AC
Start: 2020-11-06 — End: 2020-11-06
  Administered 2020-11-06: 60 mg via SUBCUTANEOUS
  Filled 2020-11-06: qty 1

## 2020-11-06 NOTE — Progress Notes (Signed)
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    INTERVAL HISTORY: Patient returns to clinic today for routine 32-monthevaluation and continuation of Prolia. She was last seen in clinic on 04/30/21.   She presents today with her daughter.  She is doing well.  Denies any new concerns.  Tolerating tamoxifen well.  REVIEW OF SYSTEMS:   Review of Systems  Constitutional: Negative.  Negative for fever, malaise/fatigue and weight loss.  Respiratory: Negative.  Negative for cough, hemoptysis and shortness of breath.   Cardiovascular: Negative.  Negative for chest pain and leg swelling.  Gastrointestinal: Negative.  Negative for abdominal pain.  Genitourinary: Negative.  Negative for dysuria.  Musculoskeletal: Negative.  Negative for back pain.  Skin: Negative.  Negative for rash.  Neurological: Negative.  Negative for dizziness, sensory change, focal weakness, weakness and headaches.  Psychiatric/Behavioral: Negative.  The patient is not nervous/anxious.    As per HPI. Otherwise, a complete review of systems is negative.  PAST MEDICAL HISTORY: Past Medical History:  Diagnosis Date   Breast cancer ([facility] 04/2018   left   Cancer ([facility] 04/13/2018   left breast cancer   Depression    Family history of breast cancer    Hyperlipidemia    Hypertension     PAST SURGICAL HISTORY: Past Surgical History:  Procedure Laterality Date   ABDOMINAL HYSTERECTOMY     APPENDECTOMY     BACK SURGERY     BREAST BIOPSY Left 04/2018   CATARACT EXTRACTION, BILATERAL     CHOLECYSTECTOMY     EYE SURGERY     HYSTERECTOMY  ABDOMINAL WITH SALPINGO-OOPHORECTOMY     benign reason   INNER EAR SURGERY Bilateral    LUMBAR DISC SURGERY     [place]   MASTECTOMY W/ SENTINEL NODE BIOPSY Left 05/17/2018   Procedure: MASTECTOMY WITH SENTINEL LYMPH NODE BIOPSY;  Surgeon: PJules Husbands MD;  Location: [facility] ORS;  Service: General;  Laterality: Left;    FAMILY HISTORY: Family History  Problem Relation Age of Onset   Breast cancer Mother 518  CAD Father    Breast cancer Maternal Aunt        dx 559s   ADVANCED DIRECTIVES (Y/N):  N  HEALTH MAINTENANCE: Social History   Tobacco Use   Smoking status: Never   Smokeless tobacco: Never  Vaping Use   Vaping Use: Never used  Substance Use Topics   Alcohol use: Never   Drug use: Never     Colonoscopy:  PAP:  Bone density:  Lipid panel:  No Known Allergies  Current Outpatient Medications  Medication Sig Dispense Refill   Biotin w/ Vitamins C & E (HAIR/SKIN/NAILS PO) Take 1 tablet by mouth daily.     denosumab (PROLIA) 60 MG/ML SOSY injection Inject 60 mg into the skin every 6 (six) months.     losartan (COZAAR) 25 MG tablet TAKE 1 TABLET EVERY DAY 90 tablet 0   meloxicam (MOBIC) 7.5 MG tablet TAKE 1 TABLET EVERY DAY 90 tablet 0   Multiple Vitamins-Minerals (ICAPS AREDS 2 PO) Take 1 capsule by mouth 2 (two) times daily.  Omega-3 Fatty Acids (FISH OIL PEARLS PO) Take 1,000 mg by mouth daily.      pravastatin (PRAVACHOL) 40 MG tablet TAKE 1 TABLET EVERY DAY 30 tablet 0   sertraline (ZOLOFT) 25 MG tablet TAKE 1 TABLET EVERY DAY 90 tablet 0   sertraline (ZOLOFT) 50 MG tablet TAKE 1 TABLET EVERY DAY 90 tablet 0   tamoxifen (NOLVADEX) 20 MG tablet TAKE 1 TABLET EVERY DAY 90 tablet 3   No current facility-administered medications for this visit.    OBJECTIVE: Vitals:   11/06/20 1039  BP: 117/67  Pulse: (!) 55  Resp: 16  Temp: 98.4 F (36.9 C)  SpO2: 98%     Body mass index is 20.94 kg/m.    ECOG FS:0 -  Asymptomatic  Physical Exam Constitutional:      Appearance: Normal appearance.  HENT:     Head: Normocephalic and atraumatic.  Eyes:     Pupils: Pupils are equal, round, and reactive to light.  Cardiovascular:     Rate and Rhythm: Normal rate and regular rhythm.     Heart sounds: Normal heart sounds. No murmur heard. Pulmonary:     Effort: Pulmonary effort is normal.     Breath sounds: Normal breath sounds. No wheezing.  Abdominal:     General: Bowel sounds are normal. There is no distension.     Palpations: Abdomen is soft.     Tenderness: There is no abdominal tenderness.  Musculoskeletal:        General: Normal range of motion.     Cervical back: Normal range of motion.  Skin:    General: Skin is warm and dry.     Findings: No rash.  Neurological:     Mental Status: She is alert and oriented to person, place, and time.  Psychiatric:        Judgment: Judgment normal.    LAB RESULTS:  Lab Results  Component Value Date   NA 140 11/06/2020   K 4.1 11/06/2020   CL 106 11/06/2020   CO2 26 11/06/2020   GLUCOSE 90 11/06/2020   BUN 25 (H) 11/06/2020   CREATININE 1.04 (H) 11/06/2020   CALCIUM 8.9 11/06/2020   PROT 5.7 (L) 11/06/2020   ALBUMIN 3.8 11/06/2020   AST 29 11/06/2020   ALT 11 11/06/2020   ALKPHOS 43 11/06/2020   BILITOT 0.7 11/06/2020   GFRNONAA 51 (L) 11/06/2020   GFRAA 60 (L) 10/30/2019    Lab Results  Component Value Date   WBC 6.8 11/06/2020   NEUTROABS 4.2 11/06/2020   HGB 12.5 11/06/2020   HCT 38.9 11/06/2020   MCV 94.4 11/06/2020   PLT 176 11/06/2020     STUDIES: DG Bone Density  Result Date: 10/20/2020 EXAM: DUAL X-RAY ABSORPTIOMETRY (DXA) FOR BONE MINERAL DENSITY IMPRESSION: crr Your patient Christina Mccarthy completed a BMD test on 10/20/2020 using the [facility] (analysis version: 14.10) manufactured by [employer]. The following summarizes the results of our evaluation. PATIENT BIOGRAPHICAL: Name: Christina Mccarthy, Christina Mccarthy  Patient ID: [id number] Birth Date: February 28, 1930 Height: 58.0 in. Gender: Female Exam Date: 10/20/2020 Weight: 103.0 lbs. Indications: Advanced Age, Bilateral Ovariectomy, Caucasian, Early Menopause, Family History of Fracture, Height Loss, high risk meds, History of Fracture (Adult), hx breast ca, Hysterectomy, Low Body Weight, Osteoporotic, POSTmenopausal Fractures: FINGER Treatments: multivitamin, prolia, tamoxifen ASSESSMENT: The BMD measured at Forearm Radius 33% is 0.566 g/cm2 with a T-score of -3.5. This patient is considered osteoporotic according to [facility]) criteria. Lumbar spine  was not utilized due to advanced degenerative changes/scoliosis, etc.The scan quality islimited by exclusion of L-spine. Site Region Measured Measured WHO Young Adult BMD Date       Age      Classification T-score DualFemur Neck Left 10/20/2020 89.3 Osteoporosis -2.8 0.646 g/cm2 DualFemur Neck Left 08/26/2019 88.1 Osteoporosis -3.0 0.624 g/cm2 DualFemur Neck Left 06/05/2018 86.9 Osteoporosis -2.8 0.655 g/cm2 DualFemur Total Mean 10/20/2020 89.3 Osteoporosis -2.7 0.668 g/cm2 DualFemur Total Mean 08/26/2019 88.1 Osteoporosis -3.2 0.606 g/cm2 DualFemur Total Mean 06/05/2018 86.9 Osteoporosis -3.0 0.634 g/cm2 Left Forearm Radius 33% 10/20/2020 89.3 Osteoporosis -3.5 0.566 g/cm2 Left Forearm Radius 33% 06/05/2018 86.9 Osteoporosis -3.6 0.562 g/cm2 World Health Organization [facility]) criteria for post-menopausal, Caucasian Women: Normal:       T-score at or above -1 SD Osteopenia:   T-score between -1 and -2.5 SD Osteoporosis: T-score at or below -2.5 SD RECOMMENDATIONS: 1. All patients should optimize calcium and vitamin D intake. 2. Consider FDA-approved medical therapies in postmenopausal women and men aged 79 years and older, based on the following: a. A hip or vertebral (clinical or morphometric) fracture b. T-score < -2.5 at the femoral neck or spine after appropriate evaluation to exclude secondary causes c. Low  bone mass (T-score between -1.0 and -2.5 at the femoral neck or spine) and a 10-year probability of a hip fracture > 3% or a 10-year probability of a major osteoporosis-related fracture > 20% based on the [place]-adapted WHO algorithm d. Clinician judgment and/or patient preferences may indicate treatment for people with 10-year fracture probabilities above or below these levels FOLLOW-UP: People with diagnosed cases of osteoporosis or at high risk for fracture should have regular bone mineral density tests. For patients eligible for Medicare, routine testing is allowed once every 2 years. The testing frequency can be increased to one year for patients who have rapidly progressing disease, those who are receiving or discontinuing medical therapy to restore bone mass, or have additional risk factors. I have reviewed this report, and agree with the above findings. Mark A. Thornton Papas, M.D. [facility] Radiology Electronically Signed   By: Lavonia Dana M.D.   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   Because of this, she did not require adjuvant XRT.   Oncotype DX was 13 and therefore low risk therefore patient did not require adjuvant chemotherapy.   Letrozole was discontinued secondary to osteoporosis and patient was recently initiated on tamoxifen.   Continue treatment for total of 5 years completing in January 2025.   Her most recent right sided screening mammogram on 08/14/20 was Bi-rads category 2 benign. Repeat mammogram in April 2023.   Return to clinic in 6 months for routine evaluation.  2.  Osteoporosis:  Patient's most recent bone mineral density on 10/20/2020 showed a T score of -3.5 (-3.4).  Continue  tamoxifen. Proceed with Prolia today. Continue calcium and vitamin D supplementation.   Patient will require repeat bone mineral density in April 2023 along with her mammogram.   Return to clinic in 6 months as above for further evaluation and continuation of treatment.    Disposition: RTC in 6 months for lab work, MD assessment and Prolia injection.   Patient expressed understanding  and was in agreement with this plan. She also understands that She can call clinic at any time with any questions, concerns, or complaints.   Cancer Staging Breast cancer, left breast [facility]) Staging form: Breast, AJCC 8th Edition - Clinical stage from 04/22/2018: Stage IB (cT2, cN0, cM0, G2, ER+, PR+, HER2-) - Signed by Lloyd Huger, MD on 04/24/2018 Histologic grading system: 3 grade system   Jacquelin Hawking, NP   11/06/2020 10:50 AM

## 2020-11-16 ENCOUNTER — Other Ambulatory Visit: Payer: Self-pay | Admitting: Internal Medicine

## 2020-11-16 DIAGNOSIS — F324 Major depressive disorder, single episode, in partial remission: Secondary | ICD-10-CM

## 2020-11-16 DIAGNOSIS — M1710 Unilateral primary osteoarthritis, unspecified knee: Secondary | ICD-10-CM

## 2020-11-16 DIAGNOSIS — I10 Essential (primary) hypertension: Secondary | ICD-10-CM

## 2020-11-16 DIAGNOSIS — E782 Mixed hyperlipidemia: Secondary | ICD-10-CM

## 2021-01-02 ENCOUNTER — Other Ambulatory Visit: Payer: Self-pay | Admitting: Internal Medicine

## 2021-01-02 DIAGNOSIS — E782 Mixed hyperlipidemia: Secondary | ICD-10-CM

## 2021-01-02 NOTE — Telephone Encounter (Signed)
Requested Prescriptions  Pending Prescriptions Disp Refills  . pravastatin (PRAVACHOL) 40 MG tablet [Pharmacy Med Name: PRAVASTATIN SODIUM 40 MG Tablet] 90 tablet 2    Sig: TAKE 1 TABLET EVERY DAY     Cardiovascular:  Antilipid - Statins Passed - 01/02/2021  7:59 AM      Passed - Total Cholesterol in normal range and within 360 days    Cholesterol, Total  Date Value Ref Range Status  08/11/2020 164 100 - 199 mg/dL Final         Passed - LDL in normal range and within 360 days    LDL Chol Calc (NIH)  Date Value Ref Range Status  08/11/2020 86 0 - 99 mg/dL Final         Passed - HDL in normal range and within 360 days    HDL  Date Value Ref Range Status  08/11/2020 59 >39 mg/dL Final         Passed - Triglycerides in normal range and within 360 days    Triglycerides  Date Value Ref Range Status  08/11/2020 107 0 - 149 mg/dL Final         Passed - Patient is not pregnant      Passed - Valid encounter within last 12 months    Recent Outpatient Visits          4 months ago Annual physical exam   [facility] Glean Hess, MD   1 year ago Essential hypertension   [facility] Glean Hess, MD   1 year ago Annual physical exam   [facility] Glean Hess, MD   2 years ago Annual physical exam   [facility] Glean Hess, MD   3 years ago Essential hypertension   [facility] Glean Hess, MD      Future Appointments            In 1 month Army Melia Jesse Sans, MD [facility], [place]   In 7 months Army Melia, Jesse Sans, MD [facility], [facility]

## 2021-02-10 ENCOUNTER — Ambulatory Visit: Payer: Medicare PPO | Admitting: Internal Medicine

## 2021-02-19 ENCOUNTER — Ambulatory Visit: Payer: Medicare PPO | Admitting: Internal Medicine

## 2021-02-19 ENCOUNTER — Other Ambulatory Visit: Payer: Self-pay

## 2021-02-19 ENCOUNTER — Encounter: Payer: Self-pay | Admitting: Internal Medicine

## 2021-02-19 VITALS — BP 124/82 | HR 51 | Ht <= 58 in | Wt 103.0 lb

## 2021-02-19 DIAGNOSIS — E782 Mixed hyperlipidemia: Secondary | ICD-10-CM | POA: Diagnosis not present

## 2021-02-19 DIAGNOSIS — F324 Major depressive disorder, single episode, in partial remission: Secondary | ICD-10-CM | POA: Diagnosis not present

## 2021-02-19 DIAGNOSIS — I10 Essential (primary) hypertension: Secondary | ICD-10-CM | POA: Diagnosis not present

## 2021-02-19 DIAGNOSIS — Z23 Encounter for immunization: Secondary | ICD-10-CM

## 2021-02-19 MED ORDER — SERTRALINE HCL 50 MG PO TABS: 50.0000 mg | ORAL_TABLET | Freq: Every day | ORAL | 1 refills | Status: DC

## 2021-02-19 MED ORDER — LOSARTAN POTASSIUM 25 MG PO TABS: 25.0000 mg | ORAL_TABLET | Freq: Every day | ORAL | 1 refills | Status: DC

## 2021-02-19 MED ORDER — SERTRALINE HCL 25 MG PO TABS: 25.0000 mg | ORAL_TABLET | Freq: Every day | ORAL | 1 refills | Status: DC

## 2021-02-19 NOTE — Progress Notes (Signed)
Date:  02/19/2021   Name:  Christina Mccarthy   DOB:  24-Oct-1929   MRN:  [id number]   Chief Complaint: Hypertension  Hypertension This is a chronic problem. The problem is controlled. Pertinent negatives include no chest pain, headaches, palpitations or shortness of breath. Past treatments include ACE inhibitors.  Depression        This is a chronic problem.The problem is unchanged.  Associated symptoms include no fatigue and no headaches.  Past treatments include SSRIs - Selective serotonin reuptake inhibitors. Hyperlipidemia This is a chronic problem. The problem is controlled. Pertinent negatives include no chest pain or shortness of breath. Current antihyperlipidemic treatment includes statins. The current treatment provides significant improvement of lipids.   Lab Results  Component Value Date   CREATININE 1.04 (H) 11/06/2020   BUN 25 (H) 11/06/2020   NA 140 11/06/2020   K 4.1 11/06/2020   CL 106 11/06/2020   CO2 26 11/06/2020   Lab Results  Component Value Date   CHOL 164 08/11/2020   HDL 59 08/11/2020   LDLCALC 86 08/11/2020   TRIG 107 08/11/2020   CHOLHDL 2.8 08/11/2020   Lab Results  Component Value Date   TSH 3.850 08/11/2020   No results found for: HGBA1C Lab Results  Component Value Date   WBC 6.8 11/06/2020   HGB 12.5 11/06/2020   HCT 38.9 11/06/2020   MCV 94.4 11/06/2020   PLT 176 11/06/2020   Lab Results  Component Value Date   ALT 11 11/06/2020   AST 29 11/06/2020   ALKPHOS 43 11/06/2020   BILITOT 0.7 11/06/2020     Review of Systems  Constitutional:  Negative for fatigue and unexpected weight change.  HENT:  Negative for nosebleeds.   Eyes:  Negative for visual disturbance.  Respiratory:  Negative for cough, chest tightness, shortness of breath and wheezing.   Cardiovascular:  Negative for chest pain, palpitations and leg swelling.  Gastrointestinal:  Negative for abdominal pain, constipation and diarrhea.  Neurological:  Negative for  dizziness, weakness, light-headedness and headaches.  Psychiatric/Behavioral:  Positive for depression.    Patient Active Problem List   Diagnosis Date Noted   Osteoporosis 09/27/2018   Breast cancer, left breast ([place]) 03/09/2018   Mixed hyperlipidemia 09/04/2017   Essential hypertension 09/04/2017   Primary osteoarthritis of knee 09/04/2017   Major depressive disorder with single episode, in partial remission ([place]) 09/04/2017    No Known Allergies  Past Surgical History:  Procedure Laterality Date   ABDOMINAL HYSTERECTOMY     APPENDECTOMY     BACK SURGERY     BREAST BIOPSY Left 04/2018   CATARACT EXTRACTION, BILATERAL     CHOLECYSTECTOMY     EYE SURGERY     HYSTERECTOMY ABDOMINAL WITH SALPINGO-OOPHORECTOMY     benign reason   INNER EAR SURGERY Bilateral    LUMBAR DISC SURGERY     MASTECTOMY Left 05/17/2018   [place]    MASTECTOMY W/ SENTINEL NODE BIOPSY Left 05/17/2018   Procedure: MASTECTOMY WITH SENTINEL LYMPH NODE BIOPSY;  Surgeon: Jules Husbands, MD;  Location: [facility] ORS;  Service: General;  Laterality: Left;    Social History   Tobacco Use   Smoking status: Never   Smokeless tobacco: Never  Vaping Use   Vaping Use: Never used  Substance Use Topics   Alcohol use: Never   Drug use: Never     Medication list has been reviewed and updated.  Current Meds  Medication Sig   Biotin w/ Vitamins  C & E (HAIR/SKIN/NAILS PO) Take 1 tablet by mouth daily.   denosumab (PROLIA) 60 MG/ML SOSY injection Inject 60 mg into the skin every 6 (six) months.   losartan (COZAAR) 25 MG tablet TAKE 1 TABLET EVERY DAY   meloxicam (MOBIC) 7.5 MG tablet TAKE 1 TABLET EVERY DAY   Multiple Vitamins-Minerals (ICAPS AREDS 2 PO) Take 1 capsule by mouth 2 (two) times daily.    Omega-3 Fatty Acids (FISH OIL PEARLS PO) Take 1,000 mg by mouth daily.    pravastatin (PRAVACHOL) 40 MG tablet TAKE 1 TABLET EVERY DAY   sertraline (ZOLOFT) 25 MG tablet TAKE 1 TABLET EVERY DAY   sertraline (ZOLOFT) 50 MG  tablet TAKE 1 TABLET EVERY DAY   tamoxifen (NOLVADEX) 20 MG tablet TAKE 1 TABLET EVERY DAY    PHQ 2/9 Scores 02/19/2021 08/11/2020 10/08/2019 03/12/2019  PHQ - 2 Score 0 0 0 0  PHQ- 9 Score 0 2 2 3     GAD 7 : Generalized Anxiety Score 02/19/2021 08/11/2020 10/08/2019  Nervous, Anxious, on Edge 0 0 0  Control/stop worrying 0 0 0  Worry too much - different things 0 0 0  Trouble relaxing 0 0 0  Restless 0 0 0  Easily annoyed or irritable 0 0 0  Afraid - awful might happen 0 0 0  Total GAD 7 Score 0 0 0  Anxiety Difficulty Not difficult at all Not difficult at all Not difficult at all    BP Readings from Last 3 Encounters:  02/19/21 124/82  11/06/20 117/67  08/11/20 96/70    Physical Exam Constitutional:      Appearance: Normal appearance.  Neck:     Vascular: No carotid bruit.  Cardiovascular:     Rate and Rhythm: Normal rate and regular rhythm.     Pulses: Normal pulses.  Pulmonary:     Effort: Pulmonary effort is normal.     Breath sounds: No wheezing or rhonchi.  Abdominal:     Palpations: Abdomen is soft.     Tenderness: There is no abdominal tenderness.  Musculoskeletal:     Cervical back: Normal range of motion.     Right lower leg: No edema.     Left lower leg: No edema.  Lymphadenopathy:     Cervical: No cervical adenopathy.  Skin:    Capillary Refill: Capillary refill takes less than 2 seconds.  Neurological:     Mental Status: She is alert. Mental status is at baseline.  Psychiatric:        Mood and Affect: Mood normal.        Behavior: Behavior normal.    Wt Readings from Last 3 Encounters:  02/19/21 103 lb (46.7 kg)  11/06/20 100 lb 3 oz (45.4 kg)  08/11/20 103 lb (46.7 kg)    BP 124/82   Pulse (!) 51   Ht 4\' 10"  (1.473 m)   Wt 103 lb (46.7 kg)   BMI 21.53 kg/m   Assessment and Plan: 1. Essential hypertension Clinically stable exam with well controlled BP. Tolerating medications without side effects at this time. Pt to continue current  regimen and low sodium diet; benefits of regular exercise as able discussed.  2. Major depressive disorder with single episode, in partial remission (HCC) Clinically stable on current regimen with good control of symptoms, No SI or HI. Will continue current therapy. She is having some memory loss but no behavior issues  3. Mixed hyperlipidemia On statin therapy with no side effects   Partially  dictated using Editor, commissioning. Any errors are unintentional.  Halina Maidens, MD [facility]  02/19/2021

## 2021-04-12 ENCOUNTER — Other Ambulatory Visit: Payer: Self-pay | Admitting: Internal Medicine

## 2021-04-12 DIAGNOSIS — M1710 Unilateral primary osteoarthritis, unspecified knee: Secondary | ICD-10-CM

## 2021-04-12 NOTE — Telephone Encounter (Signed)
Requested Prescriptions  Pending Prescriptions Disp Refills  . meloxicam (MOBIC) 7.5 MG tablet [Pharmacy Med Name: MELOXICAM 7.5 MG Tablet] 90 tablet 0    Sig: TAKE 1 TABLET EVERY DAY     Analgesics:  COX2 Inhibitors Failed - 04/12/2021  3:49 AM      Failed - Cr in normal range and within 360 days    Creatinine, Ser  Date Value Ref Range Status  11/06/2020 1.04 (H) 0.44 - 1.00 mg/dL Final         Passed - HGB in normal range and within 360 days    Hemoglobin  Date Value Ref Range Status  11/06/2020 12.5 12.0 - 15.0 g/dL Final  08/11/2020 12.7 11.1 - 15.9 g/dL Final         Passed - Patient is not pregnant      Passed - Valid encounter within last 12 months    Recent Outpatient Visits          1 month ago Essential hypertension   [facility] Glean Hess, MD   8 months ago Annual physical exam   [facility] Glean Hess, MD   1 year ago Essential hypertension   [facility] Glean Hess, MD   2 years ago Annual physical exam   [facility] Glean Hess, MD   3 years ago Annual physical exam   [facility] Glean Hess, MD      Future Appointments            In 4 months Army Melia Jesse Sans, MD [facility], [place]   In 4 months Army Melia, Jesse Sans, MD [facility], [facility]

## 2021-05-03 IMAGING — US US BREAST*R* LIMITED INC AXILLA
1 series · 3 of 3 positions shown · non-contrast
Comparison: Previous exam(s).

CLINICAL DATA: Here for evaluation of a palpable area felt by the
patient in the right breast. The patient has a history left breast
cancer status post left mastectomy in May 2018.

EXAM:
DIGITAL DIAGNOSTIC UNILATERAL RIGHT MAMMOGRAM WITH TOMOSYNTHESIS AND
CAD; ULTRASOUND RIGHT BREAST LIMITED
TECHNIQUE: Right digital diagnostic mammography and breast tomosynthesis was
performed. The images were evaluated with computer-aided detection.;
Targeted ultrasound examination of the right breast was performed

[Series 1: us breast*right* limited inc axilla · 0.06mm/px · 3 of 3 slices shown]
[im 1/3]
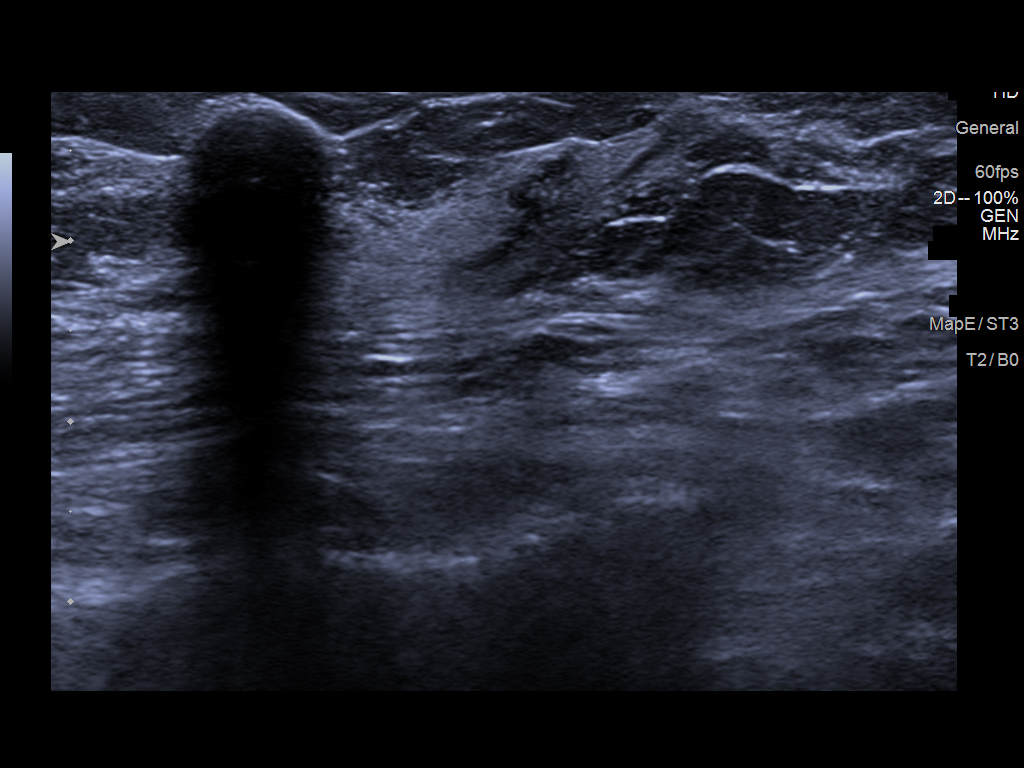
[im 2/3]
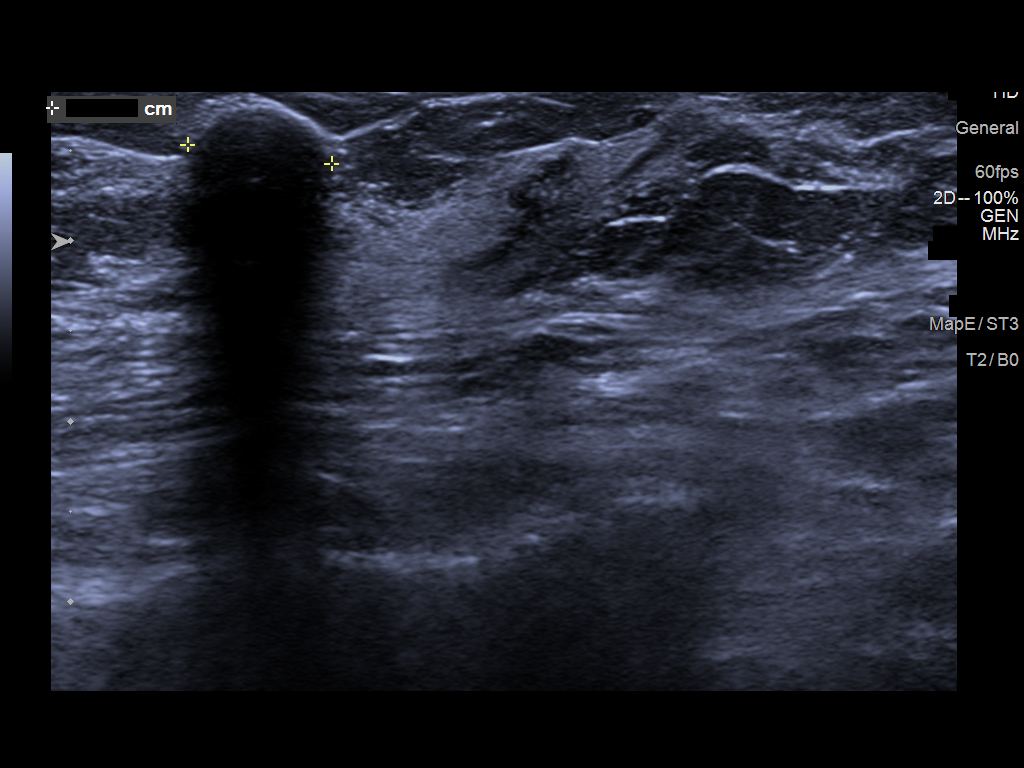
[im 3/3]
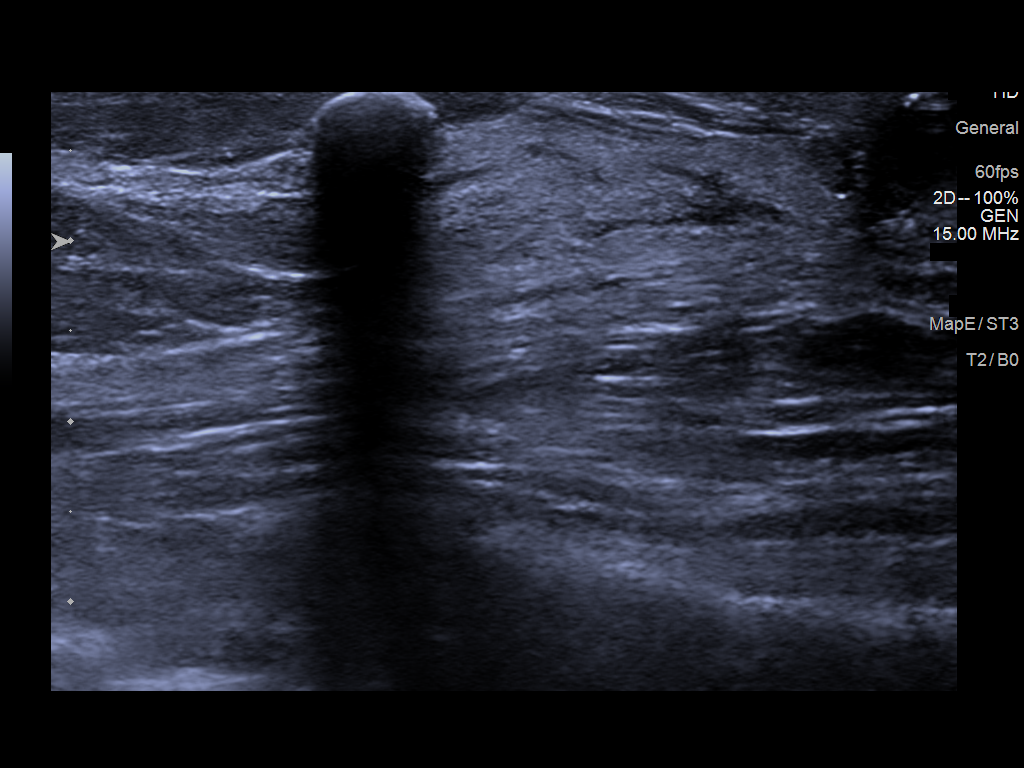

[3 of 3 positions shown; findings below may reference images not displayed]

ACR Breast Density Category b: There are scattered areas of
fibroglandular density.
FINDINGS: Spot compression was obtained over the palpable area of concern in
the right breast. No suspicious mammographic finding is identified
in this area. An 8 mm benign coarse calcification is seen near the
palpable skin marker. No suspicious mass, microcalcification, or
other finding is identified in the right breast.

Targeted right breast ultrasound was performed by the sonographer
and the physician in the palpable area of concern. No suspicious
solid or cystic mass is identified. A benign coarse calcification
measures 8 mm, however does not definitely correspond to the
palpable area felt by the patient.
IMPRESSION: No findings to explain the palpable area of concern. No evidence of
malignancy.

RECOMMENDATION:
Any further workup of the patient's symptoms should be based on the
clinical assessment. Recommend routine annual screening mammogram in
1 year.

I have discussed the findings and recommendations with the patient.
If applicable, a reminder letter will be sent to the patient
regarding the next appointment.

BI-RADS CATEGORY  2: Benign.

## 2021-05-07 ENCOUNTER — Telehealth: Payer: Self-pay | Admitting: Oncology

## 2021-05-07 NOTE — Telephone Encounter (Signed)
Daughter called to cancel pt's appts. Pt is moving to [place] With other daughter.

## 2021-05-14 ENCOUNTER — Ambulatory Visit: Payer: Medicare PPO | Admitting: Oncology

## 2021-05-14 ENCOUNTER — Other Ambulatory Visit: Payer: Medicare PPO

## 2021-05-14 ENCOUNTER — Ambulatory Visit: Payer: Medicare PPO

## 2021-05-21 ENCOUNTER — Ambulatory Visit: Payer: Medicare PPO | Admitting: Nurse Practitioner

## 2021-05-21 ENCOUNTER — Ambulatory Visit: Payer: Medicare PPO

## 2021-05-21 ENCOUNTER — Other Ambulatory Visit: Payer: Medicare PPO

## 2021-05-30 ENCOUNTER — Other Ambulatory Visit: Payer: Self-pay | Admitting: Oncology

## 2021-07-05 ENCOUNTER — Encounter: Payer: Self-pay | Admitting: Nurse Practitioner

## 2021-07-05 ENCOUNTER — Other Ambulatory Visit: Payer: Self-pay

## 2021-07-05 ENCOUNTER — Inpatient Hospital Stay (HOSPITAL_BASED_OUTPATIENT_CLINIC_OR_DEPARTMENT_OTHER): Payer: Medicare PPO | Admitting: Nurse Practitioner

## 2021-07-05 ENCOUNTER — Inpatient Hospital Stay: Payer: Medicare PPO

## 2021-07-05 ENCOUNTER — Inpatient Hospital Stay: Payer: Medicare PPO | Attending: Nurse Practitioner

## 2021-07-05 VITALS — BP 136/62 | HR 76 | Temp 98.2°F | Wt 97.8 lb

## 2021-07-05 DIAGNOSIS — Z79899 Other long term (current) drug therapy: Secondary | ICD-10-CM | POA: Insufficient documentation

## 2021-07-05 DIAGNOSIS — Z170 Estrogen receptor positive status [ER+]: Secondary | ICD-10-CM | POA: Diagnosis not present

## 2021-07-05 DIAGNOSIS — M810 Age-related osteoporosis without current pathological fracture: Secondary | ICD-10-CM | POA: Diagnosis not present

## 2021-07-05 DIAGNOSIS — C50412 Malignant neoplasm of upper-outer quadrant of left female breast: Secondary | ICD-10-CM | POA: Insufficient documentation

## 2021-07-05 DIAGNOSIS — Z9012 Acquired absence of left breast and nipple: Secondary | ICD-10-CM | POA: Diagnosis not present

## 2021-07-05 DIAGNOSIS — Z5181 Encounter for therapeutic drug level monitoring: Secondary | ICD-10-CM

## 2021-07-05 DIAGNOSIS — Z853 Personal history of malignant neoplasm of breast: Secondary | ICD-10-CM | POA: Diagnosis not present

## 2021-07-05 DIAGNOSIS — Z79810 Long term (current) use of selective estrogen receptor modulators (SERMs): Secondary | ICD-10-CM | POA: Insufficient documentation

## 2021-07-05 DIAGNOSIS — Z08 Encounter for follow-up examination after completed treatment for malignant neoplasm: Secondary | ICD-10-CM

## 2021-07-05 LAB — CBC WITH DIFFERENTIAL/PLATELET
Abs Immature Granulocytes: 0.05 10*3/uL (ref 0.00–0.07)
Basophils Absolute: 0.1 10*3/uL (ref 0.0–0.1)
Basophils Relative: 1 %
Eosinophils Absolute: 0.3 10*3/uL (ref 0.0–0.5)
Eosinophils Relative: 5 %
HCT: 41.3 % (ref 36.0–46.0)
Hemoglobin: 13.2 g/dL (ref 12.0–15.0)
Immature Granulocytes: 1 %
Lymphocytes Relative: 20 %
Lymphs Abs: 1.4 10*3/uL (ref 0.7–4.0)
MCH: 29.8 pg (ref 26.0–34.0)
MCHC: 32 g/dL (ref 30.0–36.0)
MCV: 93.2 fL (ref 80.0–100.0)
Monocytes Absolute: 0.5 10*3/uL (ref 0.1–1.0)
Monocytes Relative: 7 %
Neutro Abs: 4.7 10*3/uL (ref 1.7–7.7)
Neutrophils Relative %: 66 %
Platelets: 200 10*3/uL (ref 150–400)
RBC: 4.43 MIL/uL (ref 3.87–5.11)
RDW: 13.8 % (ref 11.5–15.5)
WBC: 7 10*3/uL (ref 4.0–10.5)
nRBC: 0 % (ref 0.0–0.2)

## 2021-07-05 LAB — COMPREHENSIVE METABOLIC PANEL
ALT: 10 U/L (ref 0–44)
AST: 25 U/L (ref 15–41)
Albumin: 4.1 g/dL (ref 3.5–5.0)
Alkaline Phosphatase: 39 U/L (ref 38–126)
Anion gap: 10 (ref 5–15)
BUN: 21 mg/dL (ref 8–23)
CO2: 24 mmol/L (ref 22–32)
Calcium: 9.2 mg/dL (ref 8.9–10.3)
Chloride: 103 mmol/L (ref 98–111)
Creatinine, Ser: 1.15 mg/dL — ABNORMAL HIGH (ref 0.44–1.00)
GFR, Estimated: 45 mL/min — ABNORMAL LOW (ref >60)
Glucose, Bld: 94 mg/dL (ref 70–99)
Potassium: 4.2 mmol/L (ref 3.5–5.1)
Sodium: 137 mmol/L (ref 135–145)
Total Bilirubin: 0.7 mg/dL (ref 0.3–1.2)
Total Protein: 6.1 g/dL — ABNORMAL LOW (ref 6.5–8.1)

## 2021-07-05 MED ORDER — DENOSUMAB 60 MG/ML ~~LOC~~ SOSY
60.0000 mg | PREFILLED_SYRINGE | Freq: Once | SUBCUTANEOUS | Status: AC
  Administered 2021-07-05: 60 mg via SUBCUTANEOUS
  Filled 2021-07-05: qty 1

## 2021-07-05 NOTE — Progress Notes (Signed)
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    INTERVAL HISTORY: Patient returns to clinic today for routine 64-monthevaluation and continuation of Prolia. [place] the [place]. She continues tamoxifen, tolerating well. Denies new complaints today. [facility] Patient denies breast pain, lumps, chest wall pain. She has lost some weight but is trying to put weight on.    REVIEW OF SYSTEMS:   Review of Systems  Constitutional: Negative.  Negative for fever, malaise/fatigue and weight loss.  Respiratory: Negative.  Negative for cough, hemoptysis and shortness of breath.   Cardiovascular: Negative.  Negative for chest pain and leg swelling.  Gastrointestinal: Negative.  Negative for abdominal pain.  Genitourinary: Negative.  Negative for dysuria.  Musculoskeletal:  Positive for joint pain (knees- unchanged). Negative for back pain.  Skin: Negative.  Negative for rash.  Neurological: Negative.  Negative for dizziness, sensory change, focal weakness, weakness and headaches.  Psychiatric/Behavioral: Negative.  The patient is not nervous/anxious.    As per HPI. Otherwise, a complete review of systems is negative.  PAST MEDICAL HISTORY: Past Medical History:  Diagnosis Date   [place]   left   [place] 04[place]/2018   left breast cancer   Depression    Family history of breast cancer    Hyperlipidemia    Hypertension     PAST SURGICAL HISTORY: Past Surgical History:  Procedure Laterality Date    ABDOMINAL HYSTERECTOMY     APPENDECTOMY     BACK SURGERY     BREAST BIOPSY Left 04/2018   CATARACT EXTRACTION, BILATERAL     CHOLECYSTECTOMY     EYE SURGERY     HYSTERECTOMY ABDOMINAL WITH SALPINGO-OOPHORECTOMY     benign reason   INNER EAR SURGERY Bilateral    LUMBAR DISC SURGERY     MASTECTOMY Left 05/17/2018   [place]   MASTECTOMY W/ SENTINEL NODE BIOPSY Left 05/17/2018   Procedure: MASTECTOMY WITH SENTINEL LYMPH NODE BIOPSY;  Surgeon: PJules Husbands MD;  Location: [facility] ORS;  Service: General;  Laterality: Left;    FAMILY HISTORY: Family History  Problem Relation Age of Onset   Breast cancer Mother 58  CAD Father    Breast cancer Maternal Aunt        dx 560s   ADVANCED DIRECTIVES (Y/N):  N  HEALTH MAINTENANCE: Social History   Tobacco Use   Smoking status: Never   Smokeless tobacco: Never  Vaping Use   Vaping Use: Never used  Substance Use Topics   Alcohol use: Never   Drug use: Never     Colonoscopy:  PAP:  Bone density:  Lipid panel:  No Known Allergies  Current Outpatient Medications  Medication Sig Dispense Refill   Biotin w/ Vitamins C & E (HAIR/SKIN/NAILS PO) Take 1 tablet by mouth daily.     denosumab (PROLIA) 60 MG/ML SOSY injection Inject 60 mg into the skin every 6 (six) months.     losartan (COZAAR) 25 MG tablet Take 1 tablet (25 mg total) by mouth daily. 90 tablet 1   meloxicam (MOBIC) 7.5  MG tablet TAKE 1 TABLET EVERY DAY 90 tablet 0   Multiple Vitamins-Minerals (ICAPS AREDS 2 PO) Take 1 capsule by mouth 2 (two) times daily.      Omega-3 Fatty Acids (FISH OIL PEARLS PO) Take 1,000 mg by mouth daily.      pravastatin (PRAVACHOL) 40 MG tablet TAKE 1 TABLET EVERY DAY 90 tablet 2   sertraline (ZOLOFT) 25 MG tablet Take 1 tablet (25 mg total) by mouth daily. 90 tablet 1   sertraline (ZOLOFT) 50 MG tablet Take 1 tablet (50 mg total) by mouth daily. 90 tablet 1   tamoxifen (NOLVADEX) 20 MG tablet TAKE 1 TABLET EVERY DAY 90 tablet 3   No current  facility-administered medications for this visit.    OBJECTIVE: Vitals:   07/05/21 1111  BP: 136/62  Pulse: 76  Temp: 98.2 F (36.8 C)     Body mass index is 20.44 kg/m.    ECOG FS:0 - Asymptomatic  Physical Exam Constitutional:      Appearance: Normal appearance. She is not ill-appearing.  HENT:     Head: Normocephalic and atraumatic.     Comments: Hard of hearing Cardiovascular:     Rate and Rhythm: Normal rate and regular rhythm.     Heart sounds: No murmur heard. Pulmonary:     Effort: Pulmonary effort is normal.     Breath sounds: Normal breath sounds. No wheezing.  Abdominal:     General: There is no distension.     Palpations: Abdomen is soft.     Tenderness: There is no abdominal tenderness.  Musculoskeletal:        General: No deformity.     Comments: 4 wheel rolling walker  Skin:    General: Skin is warm and dry.     Findings: No rash.  Neurological:     Mental Status: She is alert and oriented to person, place, and time.  Psychiatric:        Mood and Affect: Mood normal.        Behavior: Behavior normal.    LAB RESULTS:  Lab Results  Component Value Date   NA 140 11/06/2020   K 4.1 11/06/2020   CL 106 11/06/2020   CO2 26 11/06/2020   GLUCOSE 90 11/06/2020   BUN 25 (H) 11/06/2020   CREATININE 1.04 (H) 11/06/2020   CALCIUM 8.9 11/06/2020   PROT 5.7 (L) 11/06/2020   ALBUMIN 3.8 11/06/2020   AST 29 11/06/2020   ALT 11 11/06/2020   ALKPHOS 43 11/06/2020   BILITOT 0.7 11/06/2020   GFRNONAA 51 (L) 11/06/2020   GFRAA 60 (L) 10/30/2019    Lab Results  Component Value Date   WBC 7.0 07/05/2021   NEUTROABS 4.7 07/05/2021   HGB 13.2 07/05/2021   HCT 41.3 07/05/2021   MCV 93.2 07/05/2021   PLT 200 07/05/2021     STUDIES: No results found.    Surgical Pathology - 05/17/2018 CASE: [id number]  PATIENT: Christina Mccarthy  Surgical Pathology Report   SPECIMEN SUBMITTED:  A. Breast mastectomy, left  B. Sentinel lymph node, #1  C. Sentinel  lymph node, #2  D. Sentinel lymph node, #3   CLINICAL HISTORY:  None provided   PRE-OPERATIVE DIAGNOSIS:  Breast cancer   POST-OPERATIVE DIAGNOSIS:  Same as pre op   DIAGNOSIS:  A.  BREAST, LEFT; SIMPLE MASTECTOMY:  - INVASIVE MAMMARY CARCINOMA, NO SPECIAL TYPE.  - CLIP AND BIOPSY SITE IDENTIFIED.  - BENIGN NIPPLE/AREOLAR COMPLEX.  - SEE CANCER SUMMARY.  B.  LYMPH NODE, LEFT SENTINEL #1; EXCISION:  - ONE LYMPH NODE, NEGATIVE FOR MALIGNANCY (0/1).   C. LYMPH NODE, LEFT SENTINEL #2; EXCISION:  - ONE LYMPH NODE, NEGATIVE FOR MALIGNANCY (0/1).   D. LYMPH NODE, LEFT SENTINEL #3; EXCISION:  - ONE LYMPH NODE, NEGATIVE FOR MALIGNANCY (0/1).   CANCER CASE SUMMARY: INVASIVE CARCINOMA OF THE BREAST  Procedure: Total mastectomy  Specimen Laterality: Left  Tumor Size: 26 mm  Histologic Type: Invasive carcinoma of no special type  Histologic Grade (Nottingham Histologic Score)                       Glandular (Acinar)/Tubular Differentiation: Score 2                       Nuclear Pleomorphism: Score 1                       Mitotic Rate: Score 1                       Overall Grade: Grade 1  Ductal Carcinoma In Situ (DCIS): Present, low-grade   Tumor extension:  Skin: Skin is present and uninvolved  Nipple: DCIS does not involve the nipple epidermis   Margins:                       Invasive Carcinoma Margins: Uninvolved by invasive  carcinoma                       Distance from closest margin: 5 mm                       Specify closest margin: Deep                        DCIS Margins: Uninvolved by DCIS                       Distance from closest margin: 6 mm                       Specify closest margin: Deep   Regional Lymph Nodes: Uninvolved by tumor cells  Number of Lymph Nodes Examined: 3  Number of Sentinel Nodes Examined: 3   Lymphovascular Invasion: Not identified  Pathologic Stage Classification (pTNM, AJCC 8th Edition): pT2 pN0 (sn)  TNM Descriptors: N/A    Breast biomarker testing was performed on the previous biopsy (see  [id number]), with the following results:  Estrogen Receptor (ER) Status: POSITIVE  Progesterone Receptor (PgR) Status: POSITIVE  HER2 (by immunohistochemistry): NEGATIVE (Score 1+)     GROSS DESCRIPTION:  A. Labeled: Left mastectomy  Received: Fresh and placed into formalin  Time in fixative: Collected at 11:24 AM and placed into formalin at  11:35 AM on 05/17/2018  Cold ischemic time: Less than 1 hour  Total fixation time: 30 hours  Type of mastectomy: Simple mastectomy  Laterality: Left  Weight of specimen: 631 grams  Size of specimen: 23.5 x 19.7 x 2.8 cm  Orientation of specimen: There are surgical metallic markers designating  cranial, medial, and deep.  Inking scheme: Superficial - blue, deep - black, and lateral - orange.  Skin ellipse dimensions  description: Attached to the superficial  aspect is a 24.0 x 9.7 cm elliptical excision of tan,  grossly  unremarkable skin.  Nipple/ areola: The nipple is tan, slightly retracted, and 0.5 cm in  diameter with a surrounding 2.7 x 2.6 cm areola.  Axillary tail: Absent  Biopsy site(s): Present  Number of discrete masses: 1  Location of mass(es): Upper outer quadrant at approximately 1:00  Distance between masses: Not applicable   Size of mass(es)/biopsy site(s): 2.6 x 1.7 x 1.5 cm  Description of mass(es)/biopsy site(s): Sectioning displays an  ill-defined, indurated mass with a pale-tan cut surface.  Embedded  within the mass is a biopsy clip.  Immediately lateral to the mass is a  3.5 x 2.0 x 1.0 cm focal area of slightly irregular fibrous tissue.  The  irregular fibrous tissue is 1.5 cm from the closest surgical resection  margin (deep).  Margins: Deep - 0.8 cm, closest superficial (superior) - 0.8 cm, lateral  aspect - 6.8 cm, medial aspect - 13.5 cm, and overlying skin - 2.7 cm.  Gross involvement of skin/fascia/muscle by tumor: None grossly   identified  Description of remaining breast: Sectioning the remainder of the  specimen displays tan-yellow, lobulated, otherwise grossly unremarkable  breast parenchyma with a fibrous to adipose ratio of 5:95.  No  additional abnormalities or mass lesions are grossly identified.  Lymph nodes: No intraparenchymal lymph nodes are grossly identified.   Block Summary:  1 - nipple (entire, bisected)  2-3 - mass in relation to deep resection margin  4 - mass in relation to closest superficial resection margin (superior)  5 - mass with site of biopsy clip  6-8 - mass  9-10 - irregular fibrous tissue immediately lateral to mass  11 - deep resection margin closest to irregular fibrous tissue  (perpendicular)  12 - grossly normal upper outer quadrant  13 - grossly normal upper inner quadrant  14 - grossly normal lower outer quadrant  15 - grossly normal lower inner quadrant   B. Labeled: Sentinel lymph node #1  Received: Formalin  Tissue fragment(s): 1  Size: 0.5 x 0.5 x 0.3 cm  Description: Tan lymph node with minimal attached adipose tissue.  Submitted intact.  Entirely submitted in cassette 1.   C. Labeled: Sentinel lymph node #2  Received: Formalin  Tissue fragment(s): 1  Size: 1.3 x 1.0 x 0.5 cm  Description: Irregular fragment of tan soft tissue.  Palpation displays  1 slightly disrupted lymph node candidate measuring 0.8 cm in greatest  dimension.  Submitted intact.  Entirely submitted in cassette 1.   D. Labeled: Sentinel lymph node #3  Received: Formalin  Tissue fragment(s): 1  Size: 0.6 x 0.3 x 0.3 cm  Description: Tan lymph node candidate with minimal attached adipose  tissue.  Submitted intact.  Entirely submitted in cassette 1.   Final Diagnosis performed by Allena Napoleon, MD.   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 Because of this, she did not require adjuvant XRT. Oncotype DX was 13, low risk, and she did not require adjuvant chemotherapy. Met with genetic counseling but declined testing. She was initially started on letrozole, however discontinued d/t osteoporosis. Now on tamoxifen. TOlerating well. Plan to continue for 5 years completing in January 2025. Given mastectomy she does not  require left sided imaging. Plan for screening right mammograms given personal history of breast cancer at least through 2025. Her most recent mammo was 08/14/20 and reported as bi-rads category 2: benign. Plan to repeat in April 2023. Continue 6 month surveillance per NCCN guidelines.   2.  Osteoporosis: Patient's most recent bone mineral density on 10/20/2020 showed a T score of -3.5 (-3.4). Continue prolia. Labs today reviewed and acceptable for treatment. She will also continue calcium 1200 mg and vitamin d 1000 iu. Plan to repeat bone denisty in June 2023. Next prolia due in September 2023.    3. Unintentional Weight loss- 6 lb weight loss over past 5-6 months. Encouraged protein and calorie dense foods. If ongoing weight loss, consider ct imaging.   Disposition: Patient has relocated to [place], [place]. We will assist in transferring her records there. Will refer to [facility] to establish care. She will not have follow up visit scheduled at this time but we are happy to consider virtual visits in the future as needed. Thank you for allowing [place] to participate in your care.   Patient expressed understanding and was in agreement with this plan. She also understands that She can call clinic at any time with any questions, concerns, or complaints.    Cancer Staging  Breast cancer, left breast [facility]) Staging form: Breast, AJCC 8th Edition - Clinical stage from  04/22/2018: Stage IB (cT2, cN0, cM0, G2, ER+, PR+, HER2-) - Signed by Lloyd Huger, MD on 04/24/2018 Histologic grading system: 3 grade system   Verlon Au, NP   07/05/2021  [place] [facility] [place]: Dr. Delight Hoh

## 2021-07-05 NOTE — Progress Notes (Signed)
Pt now lives with Daughter in [place] and she is worried about her mom's weight. She is not eating.  ?Pt is not drinking much fluids and daughter is concerned that she is not drinking enough water no matter how much she is pushing it. ? ?

## 2021-07-05 NOTE — Progress Notes (Signed)
Referral sent to [facility] and receipt confirmed via fax.  ?

## 2021-08-13 ENCOUNTER — Ambulatory Visit: Payer: Medicare PPO | Admitting: Internal Medicine

## 2021-08-16 ENCOUNTER — Encounter: Payer: Medicare PPO | Admitting: Internal Medicine

## 2021-09-01 ENCOUNTER — Other Ambulatory Visit: Payer: Self-pay | Admitting: Internal Medicine

## 2021-09-01 DIAGNOSIS — M1710 Unilateral primary osteoarthritis, unspecified knee: Secondary | ICD-10-CM

## 2021-09-01 DIAGNOSIS — I10 Essential (primary) hypertension: Secondary | ICD-10-CM

## 2021-09-01 DIAGNOSIS — F324 Major depressive disorder, single episode, in partial remission: Secondary | ICD-10-CM

## 2021-09-01 NOTE — Telephone Encounter (Signed)
Patient has moved- call to patient/daughter- left message office is getting RF request- please forward to new provider so patient will not miss RF. ?Requested Prescriptions  ?Pending Prescriptions Disp Refills  ?? losartan (COZAAR) 25 MG tablet [Pharmacy Med Name: LOSARTAN POTASSIUM 25 MG Tablet] 90 tablet 1  ?  Sig: TAKE 1 TABLET EVERY DAY  ?  ? Cardiovascular:  Angiotensin Receptor Blockers Failed - 09/01/2021  2:58 AM  ?  ?  Failed - Cr in normal range and within 180 days  ?  Creatinine, Ser  ?Date Value Ref Range Status  ?07/05/2021 1.15 (H) 0.44 - 1.00 mg/dL Final  ?   ?  ?  Failed - Valid encounter within last 6 months  ?  Recent Outpatient Visits   ?      ? 6 months ago Essential hypertension  ? [facility] Glean Hess, MD  ? 1 year ago Annual physical exam  ? [facility] Glean Hess, MD  ? 1 year ago Essential hypertension  ? [facility] Glean Hess, MD  ? 2 years ago Annual physical exam  ? [facility] Glean Hess, MD  ? 3 years ago Annual physical exam  ? [facility] Glean Hess, MD  ?  ?  ? ?  ?  ?  Passed - K in normal range and within 180 days  ?  Potassium  ?Date Value Ref Range Status  ?07/05/2021 4.2 3.5 - 5.1 mmol/L Final  ?   ?  ?  Passed - Patient is not pregnant  ?  ?  Passed - Last BP in normal range  ?  BP Readings from Last 1 Encounters:  ?07/05/21 136/62  ?   ?  ?  ?? sertraline (ZOLOFT) 50 MG tablet [Pharmacy Med Name: SERTRALINE HCL 50 MG Tablet] 90 tablet 1  ?  Sig: TAKE 1 TABLET EVERY DAY  ?  ? Psychiatry:  Antidepressants - SSRI - sertraline Failed - 09/01/2021  2:58 AM  ?  ?  Failed - Valid encounter within last 6 months  ?  Recent Outpatient Visits   ?      ? 6 months ago Essential hypertension  ? [facility] Glean Hess, MD  ? 1 year ago Annual physical exam  ? [facility] Glean Hess, MD  ? 1 year ago Essential hypertension  ? [facility] Glean Hess, MD  ? 2  years ago Annual physical exam  ? [facility] Glean Hess, MD  ? 3 years ago Annual physical exam  ? [facility] Glean Hess, MD  ?  ?  ? ?  ?  ?  Passed - AST in normal range and within 360 days  ?  AST  ?Date Value Ref Range Status  ?07/05/2021 25 15 - 41 U/L Final  ?   ?  ?  Passed - ALT in normal range and within 360 days  ?  ALT  ?Date Value Ref Range Status  ?07/05/2021 10 0 - 44 U/L Final  ?   ?  ?  Passed - Completed PHQ-2 or PHQ-9 in the last 360 days  ?  ?  ?? sertraline (ZOLOFT) 25 MG tablet [Pharmacy Med Name: SERTRALINE HCL 25 MG Tablet] 90 tablet 1  ?  Sig: TAKE 1 TABLET EVERY DAY  ?  ? Psychiatry:  Antidepressants - SSRI - sertraline Failed - 09/01/2021  2:58 AM  ?  ?  Failed - Valid encounter within last 6 months  ?  Recent Outpatient Visits   ?      ? 6 months ago Essential hypertension  ? [facility] Glean Hess, MD  ? 1 year ago Annual physical exam  ? [facility] Glean Hess, MD  ? 1 year ago Essential hypertension  ? [facility] Glean Hess, MD  ? 2 years ago Annual physical exam  ? [facility] Glean Hess, MD  ? 3 years ago Annual physical exam  ? [facility] Glean Hess, MD  ?  ?  ? ?  ?  ?  Passed - AST in normal range and within 360 days  ?  AST  ?Date Value Ref Range Status  ?07/05/2021 25 15 - 41 U/L Final  ?   ?  ?  Passed - ALT in normal range and within 360 days  ?  ALT  ?Date Value Ref Range Status  ?07/05/2021 10 0 - 44 U/L Final  ?   ?  ?  Passed - Completed PHQ-2 or PHQ-9 in the last 360 days  ?  ?  ?? meloxicam (MOBIC) 7.5 MG tablet [Pharmacy Med Name: MELOXICAM 7.5 MG Tablet] 90 tablet 0  ?  Sig: TAKE 1 TABLET EVERY DAY  ?  ? Analgesics:  COX2 Inhibitors Failed - 09/01/2021  2:58 AM  ?  ?  Failed - Manual Review: Labs are only required if the patient has taken medication for more than 8 weeks.  ?  ?  Failed - Cr in normal range and within 360 days  ?  Creatinine, Ser  ?Date  Value Ref Range Status  ?07/05/2021 1.15 (H) 0.44 - 1.00 mg/dL Final  ?   ?  ?  Passed - HGB in normal range and within 360 days  ?  Hemoglobin  ?Date Value Ref Range Status  ?07/05/2021 13.2 12.0 - 15.0 g/dL Final  ?08/11/2020 12.7 11.1 - 15.9 g/dL Final  ?   ?  ?  Passed - HCT in normal range and within 360 days  ?  HCT  ?Date Value Ref Range Status  ?07/05/2021 41.3 36.0 - 46.0 % Final  ? ?Hematocrit  ?Date Value Ref Range Status  ?08/11/2020 39.2 34.0 - 46.6 % Final  ?   ?  ?  Passed - AST in normal range and within 360 days  ?  AST  ?Date Value Ref Range Status  ?07/05/2021 25 15 - 41 U/L Final  ?   ?  ?  Passed - ALT in normal range and within 360 days  ?  ALT  ?Date Value Ref Range Status  ?07/05/2021 10 0 - 44 U/L Final  ?   ?  ?  Passed - eGFR is 30 or above and within 360 days  ?  GFR calc Af Amer  ?Date Value Ref Range Status  ?10/30/2019 60 (L) >60 mL/min Final  ? ?GFR, Estimated  ?Date Value Ref Range Status  ?07/05/2021 45 (L) >60 mL/min Final  ?  Comment:  ?  (NOTE) ?Calculated using the CKD-EPI Creatinine Equation (2021) ?  ? ?eGFR  ?Date Value Ref Range Status  ?08/11/2020 53 (L) >59 mL/min/1.73 Final  ?   ?  ?  Passed - Patient is not pregnant  ?  ?  Passed - Valid encounter within last 12 months  ?  Recent Outpatient Visits   ?      ?  6 months ago Essential hypertension  ? [facility] Glean Hess, MD  ? 1 year ago Annual physical exam  ? [facility] Glean Hess, MD  ? 1 year ago Essential hypertension  ? [facility] Glean Hess, MD  ? 2 years ago Annual physical exam  ? [facility] Glean Hess, MD  ? 3 years ago Annual physical exam  ? [facility] Glean Hess, MD  ?  ?  ? ?  ?  ?  ? ?

## 2021-10-21 ENCOUNTER — Other Ambulatory Visit: Payer: Medicare PPO

## 2021-10-23 ENCOUNTER — Other Ambulatory Visit: Payer: Self-pay | Admitting: Internal Medicine

## 2021-10-23 DIAGNOSIS — E782 Mixed hyperlipidemia: Secondary | ICD-10-CM

## 2022-01-06 ENCOUNTER — Ambulatory Visit: Payer: Medicare PPO

## 2022-01-06 ENCOUNTER — Ambulatory Visit: Payer: Medicare PPO | Admitting: Oncology

## 2022-01-06 ENCOUNTER — Other Ambulatory Visit: Payer: Medicare PPO

## 2022-12-21 ENCOUNTER — Other Ambulatory Visit: Payer: Self-pay | Admitting: Internal Medicine

## 2022-12-21 DIAGNOSIS — E782 Mixed hyperlipidemia: Secondary | ICD-10-CM

## 2023-08-31 DEATH — deceased
